# Patient Record
Sex: Male | Born: 1975 | Race: White | Hispanic: No | Marital: Single | Smoking: Current every day smoker
Health system: Southern US, Community
[De-identification: ages and names within clinical notes are randomized; demographics above are authoritative.]

## PROBLEM LIST (undated history)

## (undated) DIAGNOSIS — I10 Essential (primary) hypertension: Secondary | ICD-10-CM

## (undated) DIAGNOSIS — F1410 Cocaine abuse, uncomplicated: Secondary | ICD-10-CM

---

## 2014-10-02 ENCOUNTER — Emergency Department (HOSPITAL_COMMUNITY): Payer: Self-pay

## 2014-10-02 ENCOUNTER — Encounter (HOSPITAL_COMMUNITY): Payer: Self-pay | Admitting: Emergency Medicine

## 2014-10-02 ENCOUNTER — Emergency Department (HOSPITAL_COMMUNITY)
Admission: EM | Admit: 2014-10-02 | Discharge: 2014-10-02 | Disposition: A | Discharge status: Home / Self Care | Payer: Self-pay | Attending: Emergency Medicine | Admitting: Emergency Medicine

## 2014-10-02 DIAGNOSIS — R000 Tachycardia, unspecified: Secondary | ICD-10-CM | POA: Insufficient documentation

## 2014-10-02 DIAGNOSIS — Z720 Tobacco use: Secondary | ICD-10-CM | POA: Insufficient documentation

## 2014-10-02 DIAGNOSIS — I10 Essential (primary) hypertension: Secondary | ICD-10-CM | POA: Insufficient documentation

## 2014-10-02 DIAGNOSIS — Z79899 Other long term (current) drug therapy: Secondary | ICD-10-CM | POA: Insufficient documentation

## 2014-10-02 DIAGNOSIS — R079 Chest pain, unspecified: Secondary | ICD-10-CM | POA: Insufficient documentation

## 2014-10-02 HISTORY — DX: Essential (primary) hypertension: I10

## 2014-10-02 LAB — BASIC METABOLIC PANEL
Anion gap: 11 (ref 5–15)
BUN: 10 mg/dL (ref 6–23)
CALCIUM: 9.2 mg/dL (ref 8.4–10.5)
CHLORIDE: 106 mmol/L (ref 96–112)
CO2: 22 mmol/L (ref 19–32)
CREATININE: 0.98 mg/dL (ref 0.50–1.35)
GFR calc Af Amer: >90 mL/min (ref >90)
GFR calc non Af Amer: >90 mL/min (ref >90)
GLUCOSE: 138 mg/dL — AB (ref 70–99)
Potassium: 3.3 mmol/L — ABNORMAL LOW (ref 3.5–5.1)
SODIUM: 139 mmol/L (ref 135–145)

## 2014-10-02 LAB — CBC
HCT: 47.1 % (ref 39.0–52.0)
Hemoglobin: 16.4 g/dL (ref 13.0–17.0)
MCH: 31.9 pg (ref 26.0–34.0)
MCHC: 34.8 g/dL (ref 30.0–36.0)
MCV: 91.6 fL (ref 78.0–100.0)
Platelets: 190 10*3/uL (ref 150–400)
RBC: 5.14 MIL/uL (ref 4.22–5.81)
RDW: 13.4 % (ref 11.5–15.5)
WBC: 11.8 10*3/uL — ABNORMAL HIGH (ref 4.0–10.5)

## 2014-10-02 LAB — I-STAT TROPONIN, ED: Troponin i, poc: 0 ng/mL (ref 0.00–0.08)

## 2014-10-02 MED ORDER — LORAZEPAM 2 MG/ML IJ SOLN
2.0000 mg | Freq: Once | INTRAMUSCULAR | Status: AC
  Administered 2014-10-02: 2 mg via INTRAVENOUS

## 2014-10-02 MED ORDER — SODIUM CHLORIDE 0.9 % IV BOLUS (SEPSIS)
1000.0000 mL | Freq: Once | INTRAVENOUS | Status: AC
  Administered 2014-10-02: 1000 mL via INTRAVENOUS

## 2014-10-02 MED ORDER — ONDANSETRON HCL 4 MG/2ML IJ SOLN
4.0000 mg | Freq: Once | INTRAMUSCULAR | Status: AC
  Administered 2014-10-02: 4 mg via INTRAVENOUS
  Filled 2014-10-02: qty 2

## 2014-10-02 MED ORDER — LORAZEPAM 2 MG/ML IJ SOLN
1.0000 mg | INTRAMUSCULAR | Status: DC | PRN
  Filled 2014-10-02: qty 1

## 2014-10-02 MED ORDER — MORPHINE SULFATE 4 MG/ML IJ SOLN
4.0000 mg | INTRAMUSCULAR | Status: DC | PRN
  Administered 2014-10-02: 4 mg via INTRAVENOUS
  Filled 2014-10-02: qty 1

## 2014-10-02 MED ORDER — DILTIAZEM HCL 25 MG/5ML IV SOLN
20.0000 mg | Freq: Once | INTRAVENOUS | Status: AC
Start: 2014-10-02 — End: 2014-10-02
  Administered 2014-10-02: 20 mg via INTRAVENOUS
  Filled 2014-10-02: qty 5

## 2014-10-02 NOTE — ED Notes (Signed)
Pt states he feels better, denies any pain at this time, and wants to be discharged. Pt refusing any more blood work. Dr Fayrene FearingJames aware.

## 2014-10-02 NOTE — ED Notes (Signed)
Pt c/o left chest starting one hour ago. He reports snorting cocaine and taking a Viagra earlier today. Nausea  And shortness of breath present.

## 2014-10-02 NOTE — Discharge Instructions (Signed)
Avoid cocaine.  Stop smoking.  Return to ER with any worsening of symptoms. Chest Pain (Nonspecific) It is often hard to give a specific diagnosis for the cause of chest pain. There is always a chance that your pain could be related to something serious, such as a heart attack or a blood clot in the lungs. You need to follow up with your health care provider for further evaluation. CAUSES   Heartburn.  Pneumonia or bronchitis.  Anxiety or stress.  Inflammation around your heart (pericarditis) or lung (pleuritis or pleurisy).  A blood clot in the lung.  A collapsed lung (pneumothorax). It can develop suddenly on its own (spontaneous pneumothorax) or from trauma to the chest.  Shingles infection (herpes zoster virus). The chest wall is composed of bones, muscles, and cartilage. Any of these can be the source of the pain.  The bones can be bruised by injury.  The muscles or cartilage can be strained by coughing or overwork.  The cartilage can be affected by inflammation and become sore (costochondritis). DIAGNOSIS  Lab tests or other studies may be needed to find the cause of your pain. Your health care provider may have you take a test called an ambulatory electrocardiogram (ECG). An ECG records your heartbeat patterns over a 24-hour period. You may also have other tests, such as:  Transthoracic echocardiogram (TTE). During echocardiography, sound waves are used to evaluate how blood flows through your heart.  Transesophageal echocardiogram (TEE).  Cardiac monitoring. This allows your health care provider to monitor your heart rate and rhythm in real time.  Holter monitor. This is a portable device that records your heartbeat and can help diagnose heart arrhythmias. It allows your health care provider to track your heart activity for several days, if needed.  Stress tests by exercise or by giving medicine that makes the heart beat faster. TREATMENT   Treatment depends on what  may be causing your chest pain. Treatment may include:  Acid blockers for heartburn.  Anti-inflammatory medicine.  Pain medicine for inflammatory conditions.  Antibiotics if an infection is present.  You may be advised to change lifestyle habits. This includes stopping smoking and avoiding alcohol, caffeine, and chocolate.  You may be advised to keep your head raised (elevated) when sleeping. This reduces the chance of acid going backward from your stomach into your esophagus. Most of the time, nonspecific chest pain will improve within 2-3 days with rest and mild pain medicine.  HOME CARE INSTRUCTIONS   If antibiotics were prescribed, take them as directed. Finish them even if you start to feel better.  For the next few days, avoid physical activities that bring on chest pain. Continue physical activities as directed.  Do not use any tobacco products, including cigarettes, chewing tobacco, or electronic cigarettes.  Avoid drinking alcohol.  Only take medicine as directed by your health care provider.  Follow your health care provider's suggestions for further testing if your chest pain does not go away.  Keep any follow-up appointments you made. If you do not go to an appointment, you could develop lasting (chronic) problems with pain. If there is any problem keeping an appointment, call to reschedule. SEEK MEDICAL CARE IF:   Your chest pain does not go away, even after treatment.  You have a rash with blisters on your chest.  You have a fever. SEEK IMMEDIATE MEDICAL CARE IF:   You have increased chest pain or pain that spreads to your arm, neck, jaw, back, or abdomen.  You have shortness of breath.  You have an increasing cough, or you cough up blood.  You have severe back or abdominal pain.  You feel nauseous or vomit.  You have severe weakness.  You faint.  You have chills. This is an emergency. Do not wait to see if the pain will go away. Get medical help at  once. Call your local emergency services (911 in [place] Do not drive yourself to the hospital. MAKE SURE YOU:   Understand these instructions.  Will watch your condition.  Will get help right away if you are not doing well or get worse. Document Released: 03/27/2005 Document Revised: 06/22/2013 Document Reviewed: 01/21/2008 [facility] Patient Information 2015 [place], [place]. This information is not intended to replace advice given to you by your health care provider. Make sure you discuss any questions you have with your health care provider.

## 2014-10-02 NOTE — ED Notes (Signed)
Pt to CT

## 2014-10-02 NOTE — ED Provider Notes (Addendum)
CSN: [id number]     Arrival date & time 10/02/14  1645 History   First MD Initiated Contact with Patient 10/02/14 1710     Chief Complaint  Patient presents with  . Chest Pain      HPI  She presents for evaluation of chest pain. Pain started approximately 1 a.m. States he snorted cocaine this morning and use Viagra. No history of any heart disease. States he uses cocaine "some" last use was to 3 years ago. Denies episodes of chest pain associated with cocaine use. No known heart disease. History of hypertension, not currently medicated.  Since his back hurts but it "always hurts". Had some pain in the anterior aspect of his neck earlier today "it's gone now".  Past Medical History  Diagnosis Date  . Hypertension    History reviewed. No pertinent past surgical history. History reviewed. No pertinent family history. History  Substance Use Topics  . Smoking status: Current Every Day Smoker -- 1.00 packs/day    Types: Cigarettes  . Smokeless tobacco: Not on file  . Alcohol Use: Yes     Comment: social    Review of Systems  Constitutional: Negative for fever, chills, diaphoresis, appetite change and fatigue.  HENT: Negative for mouth sores, sore throat and trouble swallowing.   Eyes: Negative for visual disturbance.  Respiratory: Negative for cough, chest tightness, shortness of breath and wheezing.   Cardiovascular: Negative for chest pain.  Gastrointestinal: Negative for nausea, vomiting, abdominal pain, diarrhea and abdominal distention.  Endocrine: Negative for polydipsia, polyphagia and polyuria.  Genitourinary: Negative for dysuria, frequency and hematuria.  Musculoskeletal: Negative for gait problem.  Skin: Negative for color change, pallor and rash.  Neurological: Negative for dizziness, syncope, light-headedness and headaches.  Hematological: Does not bruise/bleed easily.  Psychiatric/Behavioral: Negative for behavioral problems and confusion.      Allergies   Iodine and Shellfish allergy  Home Medications   Prior to Admission medications   Medication Sig Start Date End Date Taking? Authorizing Provider  Chlorpheniramine Maleate (ALLERGY PO) Take 1 tablet by mouth daily.   Yes Historical Provider, MD  metoprolol tartrate (LOPRESSOR) 25 MG tablet Take 25 mg by mouth 2 (two) times daily.   Yes Historical Provider, MD  Ranitidine HCl (ACID REDUCER PO) Take 1 tablet by mouth daily.   Yes Historical Provider, MD   BP 125/64 mmHg  Pulse 118  Temp(Src) 98.8 F (37.1 C) (Oral)  Resp 18  Wt 215 lb (97.523 kg)  SpO2 96% Physical Exam  ED Course  Procedures (including critical care time) Labs Review Labs Reviewed  CBC - Abnormal; Notable for the following:    WBC 11.8 (*)    All other components within normal limits  BASIC METABOLIC PANEL - Abnormal; Notable for the following:    Potassium 3.3 (*)    Glucose, Bld 138 (*)    All other components within normal limits  I-STAT TROPOININ, ED    Imaging Review No results found.   EKG Interpretation   Date/Time:  Sunday October 02 2014 16:58:29 EDT Ventricular Rate:  123 PR Interval:  180 QRS Duration: 93 QT Interval:  317 QTC Calculation: 453 R Axis:   92 Text Interpretation:  Sinus tachycardia Prominent P waves, nondiagnostic  Borderline right axis deviation Anteroseptal infarct, old Baseline wander  in lead(s) V3 Confirmed by Fayrene Fearing  MD, Charisse Wendell ([id number]) on 10/02/2014 5:23:19 PM      MDM   Final diagnoses:  Chest pain    Pt with tachycardia.  EKG without acute changes to suggest ischemia.  Neg d dimer.  No abnormality on CT of the chest without contrast. I requested serial enzymes over the next 3-6 hours. Patient declines and refuses. States his symptoms are resolved. Recheck heart rate 103. Have asked to return to emergency with any time. Avoid cocaine, avoid tobacco.    Rolland PorterMark Arshia Spellman, MD 10/07/14 [id number]  Rolland PorterMark Jimie Kuwahara, MD 10/07/14 [phone]

## 2014-11-01 ENCOUNTER — Encounter (HOSPITAL_COMMUNITY): Payer: Self-pay

## 2014-11-01 ENCOUNTER — Emergency Department (HOSPITAL_COMMUNITY)
Admission: EM | Admit: 2014-11-01 | Discharge: 2014-11-01 | Disposition: A | Discharge status: Home / Self Care | Payer: Self-pay | Attending: Emergency Medicine | Admitting: Emergency Medicine

## 2014-11-01 DIAGNOSIS — M546 Pain in thoracic spine: Secondary | ICD-10-CM | POA: Insufficient documentation

## 2014-11-01 DIAGNOSIS — I10 Essential (primary) hypertension: Secondary | ICD-10-CM | POA: Insufficient documentation

## 2014-11-01 DIAGNOSIS — Z79899 Other long term (current) drug therapy: Secondary | ICD-10-CM | POA: Insufficient documentation

## 2014-11-01 DIAGNOSIS — M542 Cervicalgia: Secondary | ICD-10-CM | POA: Insufficient documentation

## 2014-11-01 DIAGNOSIS — M549 Dorsalgia, unspecified: Secondary | ICD-10-CM

## 2014-11-01 DIAGNOSIS — Z720 Tobacco use: Secondary | ICD-10-CM | POA: Insufficient documentation

## 2014-11-01 LAB — COMPREHENSIVE METABOLIC PANEL
ALT: 17 U/L (ref 17–63)
AST: 15 U/L (ref 15–41)
Albumin: 4.1 g/dL (ref 3.5–5.0)
Alkaline Phosphatase: 57 U/L (ref 38–126)
Anion gap: 7 (ref 5–15)
BUN: 6 mg/dL (ref 6–20)
CALCIUM: 8.8 mg/dL — AB (ref 8.9–10.3)
CHLORIDE: 105 mmol/L (ref 101–111)
CO2: 28 mmol/L (ref 22–32)
CREATININE: 0.88 mg/dL (ref 0.61–1.24)
GFR calc Af Amer: >60 mL/min (ref >60)
GFR calc non Af Amer: >60 mL/min (ref >60)
GLUCOSE: 103 mg/dL — AB (ref 70–99)
POTASSIUM: 3.5 mmol/L (ref 3.5–5.1)
Sodium: 140 mmol/L (ref 135–145)
Total Bilirubin: 0.6 mg/dL (ref 0.3–1.2)
Total Protein: 6.9 g/dL (ref 6.5–8.1)

## 2014-11-01 LAB — CBC WITH DIFFERENTIAL/PLATELET
BASOS ABS: 0 10*3/uL (ref 0.0–0.1)
Basophils Relative: 1 % (ref 0–1)
EOS PCT: 3 % (ref 0–5)
Eosinophils Absolute: 0.2 10*3/uL (ref 0.0–0.7)
HCT: 45.5 % (ref 39.0–52.0)
Hemoglobin: 15.4 g/dL (ref 13.0–17.0)
Lymphocytes Relative: 27 % (ref 12–46)
Lymphs Abs: 1.9 10*3/uL (ref 0.7–4.0)
MCH: 31.5 pg (ref 26.0–34.0)
MCHC: 33.8 g/dL (ref 30.0–36.0)
MCV: 93 fL (ref 78.0–100.0)
Monocytes Absolute: 0.7 10*3/uL (ref 0.1–1.0)
Monocytes Relative: 9 % (ref 3–12)
NEUTROS ABS: 4.2 10*3/uL (ref 1.7–7.7)
Neutrophils Relative %: 60 % (ref 43–77)
PLATELETS: 189 10*3/uL (ref 150–400)
RBC: 4.89 MIL/uL (ref 4.22–5.81)
RDW: 14 % (ref 11.5–15.5)
WBC: 7 10*3/uL (ref 4.0–10.5)

## 2014-11-01 MED ORDER — ACETAMINOPHEN 500 MG PO TABS
1000.0000 mg | ORAL_TABLET | Freq: Once | ORAL | Status: AC
  Administered 2014-11-01: 1000 mg via ORAL
  Filled 2014-11-01: qty 2

## 2014-11-01 MED ORDER — IBUPROFEN 800 MG PO TABS
800.0000 mg | ORAL_TABLET | Freq: Once | ORAL | Status: AC
  Administered 2014-11-01: 800 mg via ORAL
  Filled 2014-11-01: qty 1

## 2014-11-01 MED ORDER — NAPROXEN 500 MG PO TABS: 500.0000 mg | ORAL_TABLET | Freq: Two times a day (BID) | ORAL | Status: AC

## 2014-11-01 MED ORDER — CYCLOBENZAPRINE HCL 10 MG PO TABS: 10.0000 mg | ORAL_TABLET | Freq: Two times a day (BID) | ORAL | Status: AC | PRN

## 2014-11-01 NOTE — ED Notes (Signed)
MD at bedside. 

## 2014-11-01 NOTE — ED Notes (Signed)
Pain to neck for last 2 weeks.  C/o tingling to right arm, numbness in fingers.  Rates pain 4/10 now.

## 2014-11-01 NOTE — ED Provider Notes (Signed)
CSN: [id number]     Arrival date & time 11/01/14  4098 History   First MD Initiated Contact with Patient 11/01/14 [phone]     Chief Complaint  Patient presents with  . Neck Pain     (Consider location/radiation/quality/duration/timing/severity/associated sxs/prior Treatment) HPI... Pain in right upper back for 2 weeks. No fever, sweats, chills. Patient runs a logging machine at work. Questionable tingling in his bilateral fingers right greater than left. He is ambulatory. No acute distress.  No chest pain, dyspnea, diaphoresis, nausea. Past medical history hypertension. Cigarette smoker  Past Medical History  Diagnosis Date  . Hypertension    History reviewed. No pertinent past surgical history. No family history on file. History  Substance Use Topics  . Smoking status: Current Every Day Smoker -- 1.00 packs/day    Types: Cigarettes  . Smokeless tobacco: Not on file  . Alcohol Use: Yes     Comment: social    Review of Systems  All other systems reviewed and are negative.     Allergies  Iodine and Shellfish allergy  Home Medications   Prior to Admission medications   Medication Sig Start Date End Date Taking? Authorizing Provider  Chlorpheniramine Maleate (ALLERGY PO) Take 1 tablet by mouth daily.    Historical Provider, MD  cyclobenzaprine (FLEXERIL) 10 MG tablet Take 1 tablet (10 mg total) by mouth 2 (two) times daily as needed for muscle spasms. 11/01/14   Donnetta Hutching, MD  metoprolol tartrate (LOPRESSOR) 25 MG tablet Take 25 mg by mouth 2 (two) times daily.    Historical Provider, MD  naproxen (NAPROSYN) 500 MG tablet Take 1 tablet (500 mg total) by mouth 2 (two) times daily. 11/01/14   Donnetta Hutching, MD  Ranitidine HCl (ACID REDUCER PO) Take 1 tablet by mouth daily.    Historical Provider, MD   BP 140/76 mmHg  Pulse 80  Temp(Src) 98.4 F (36.9 C) (Oral)  Resp 18  Ht  (1.854 m)  Wt 215 lb (97.523 kg)  BMI 28.37 kg/m2  SpO2 100% Physical Exam  Constitutional: He is  oriented to person, place, and time. He appears well-developed and well-nourished.  HENT:  Head: Normocephalic and atraumatic.  Eyes: Conjunctivae and EOM are normal. Pupils are equal, round, and reactive to light.  Neck: Normal range of motion. Neck supple.  Cardiovascular: Normal rate and regular rhythm.   Pulmonary/Chest: Effort normal and breath sounds normal.  Abdominal: Soft. Bowel sounds are normal.  Musculoskeletal: Normal range of motion.  Minimal right upper back tenderness  Neurological: He is alert and oriented to person, place, and time.  Skin: Skin is warm and dry.  Psychiatric: He has a normal mood and affect. His behavior is normal.  Nursing note and vitals reviewed.   ED Course  Procedures (including critical care time) Labs Review Labs Reviewed  COMPREHENSIVE METABOLIC PANEL - Abnormal; Notable for the following:    Glucose, Bld 103 (*)    Calcium 8.8 (*)    All other components within normal limits  CBC WITH DIFFERENTIAL/PLATELET    Imaging Review No results found.   EKG Interpretation   Date/Time:  Tuesday Nov 01 2014 07:42:39 EDT Ventricular Rate:  88 PR Interval:  185 QRS Duration: 84 QT Interval:  346 QTC Calculation: 419 R Axis:   79 Text Interpretation:  Sinus rhythm Confirmed by Wynter Isaacs  MD, Hazell Siwik ([id number]) on  11/01/2014 9:00:07 AM      MDM   Final diagnoses:  Neck pain  Upper back pain  on right side    Patient has a grossly normal neurological exam. EKG normal. Labs normal. Discharge medications Flexeril 10 mg and Naprosyn 500 mg. He will get primary care follow-up.    Donnetta HutchingBrian Tayna Smethurst, MD 11/01/14 1002

## 2014-11-01 NOTE — Discharge Instructions (Signed)
EKG and blood work were good. Medication for pain and muscle spasm. Need to get a primary care doctor. Resource guide given.    Emergency Department Resource Guide 1) Find a Doctor and Pay Out of Pocket Although you won't have to find out who is covered by your insurance plan, it is a good idea to ask around and get recommendations. You will then need to call the office and see if the doctor you have chosen will accept you as a new patient and what types of options they offer for patients who are self-pay. Some doctors offer discounts or will set up payment plans for their patients who do not have insurance, but you will need to ask so you aren't surprised when you get to your appointment.  2) Contact Your Local Health Department Not all health departments have doctors that can see patients for sick visits, but many do, so it is worth a call to see if yours does. If you don't know where your local health department is, you can check in your phone book. The CDC also has a tool to help you locate your state's health department, and many state websites also have listings of all of their local health departments.  3) Find a Walk-in Clinic If your illness is not likely to be very severe or complicated, you may want to try a walk in clinic. These are popping up all over the country in pharmacies, drugstores, and shopping centers. They're usually staffed by nurse practitioners or physician assistants that have been trained to treat common illnesses and complaints. They're usually fairly quick and inexpensive. However, if you have serious medical issues or chronic medical problems, these are probably not your best option.  No Primary Care Doctor: - Call Health Connect at  [phone] - they can help you locate a primary care doctor that  accepts your insurance, provides certain services, etc. - Physician Referral Service- [phone]  Chronic Pain Problems: Organization         Address  Phone    Notes  [facility]  [phone] Patients need to be referred by their primary care doctor.   Medication Assistance: Organization         Address  Phone   Notes  [facility] [address] [place], [place] [address] [phone]) [phone] --Must be a resident of [facility] [place] -- Must have NO insurance coverage whatsoever (no Medicaid/ Medicare, etc.) -- The pt. MUST have a primary care doctor that directs their care regularly and follows them in the community   Med[facility]ist  [phone]) [phone]   [employer]  [phone]    Agencies that provide inexpensive medical care: Organization         Address  Phone   Notes  [facility]  [phone](336) [phone]   [facility]    [phone](336) [phone]   [facility] [address] [place], [place] [address] [phone]) [phone]   [facility] [address], [place] [phone]) [phone]   [facility]    [phone]   [facility]    [phone](336) [phone]   [facility]  [address], [place] Phone:  [phone](336) [phone], Fax:  [phone] Hours of Operation:  9 am - 6 pm, M-F.  Also accepts Medicaid/Medicare and self-pay.  [facility]  [address], [place] Phone: [phone](336)  [phone], Fax: [phone]. Hours of Operation:  8:30 am - 5:30 pm, M-F.  Also accepts Medicaid and self-pay.  [facility] [place] [address] [place] Phone: [phone]   [place], [place], [place] [phone], Ext. [phone] Mondays & Thursdays: 7-9 AM.  First 15 patients are seen on a first come, first serve basis.    [place] Providers:  Organization         Address  Phone   Notes  [facility] [address], [place] [phone] Also accepts self-pay patients.  [facility]  [id number] [place], [place]  [phone]   [place], [address], [place] [phone]   [facility] [address] [place] [phone]   Lucianne Lei [address], [place]   [phone] Only accepts [place] Access [place] patients after they have their name applied to their card.   Self-Pay (no insurance) in [facility]:  Organization         Address  Phone   Notes  Sickle Cell Patients, [facility] [place] [phone]   [facility] [place] [phone]   [facility] [place]  [place], [place], [place] [phone]   [facility]/Dr. Osei-Bonsu  [address], [place] or [address], [place] [phone] Phone number for both Dent and [place] locations is the same.  [facility] [address], [place] [phone]   [facility] [address] [place] or [address] [phone]   [facility] [address] [place] [phone], phone; [phone], fax Sees patients 1st and 3rd Saturday of every month.  Must not qualify for public or private insurance (i.e. Medicaid, Medicare, [place] Health Choice, Veterans' Benefits)  Household income should be no more than 200% of the poverty level The clinic cannot treat you if you are pregnant or think you are pregnant  Sexually transmitted diseases are not treated at the clinic.    Dental Care: Organization         Address  Phone  Notes  [facility] [place] [phone] Accepts children up to age 49 who are enrolled in [place] or [place]; pregnant women with a Medicaid card; and children who have  applied for Medicaid or [place] Health Choice, but were declined, whose parents can pay a reduced fee at time of service.  [facility] Department of [facility]  [address], [place] [phone] Accepts children up to age 43 who are enrolled in [place] or [place]; pregnant women with a Medicaid card; and children who have applied for Medicaid or [place] Health Choice, but were declined, whose parents can pay a reduced fee at time of service.  [place]  [place] [phone] Patients are seen by appointment only. Walk-ins are not accepted. [place] will see patients 75 years of age and older. Monday - Tuesday (8am-5pm) Most Wednesdays (8:30-5pm) $30 per visit, cash only  [facility]  [address], [employer]  [place] [phone] Patients are seen by appointment only. Walk-ins are not accepted. [place] will see patients 52 years of age and older. One Wednesday Evening (Monthly: Volunteer Based).  $30 per visit, cash only  [place]  [phone] for adults; Children under age 58, call Graduate Pediatric Dentistry at [phone]. Children aged 77-14, please call [phone] to request a pediatric application.  Dental services are provided in all areas of dental care including fillings, crowns and bridges, complete and partial dentures, implants, gum treatment, root canals, and extractions. Preventive care is also provided. Treatment is provided to both adults and children. Patients are selected via a lottery and there is often a waiting list.   [facility] [address], [place]  [phone] [email or web address]   [facility] [address] [place], [place] [phone], Ext. [phone] Second and Fourth Thursday of each month, opens at 6:30 AM; Clinic ends at 9 AM.  Patients are seen on a first-come first-served basis, and a  limited number are seen during each clinic.   [facility]  [address] Hillard Danker [place], [place] [phone]   Eligibility Requirements You must have lived in [place], [place], or [place] counties for at least the last three months.   You cannot be eligible for state or federal sponsored [employer], including [employer], [place], or [employer].   You generally cannot be eligible for healthcare insurance through your employer.    How to apply: Eligibility screenings are held every Tuesday and Wednesday afternoon from 1:00 pm until 4:00 pm. You do not need an appointment for the interview!  [facility] [address], [place], [place]   [place]  [place] Department  [place]  [phone]    Behavioral Health Resources in the Community: Intensive Outpatient Programs Organization         Address  Phone  Notes  [place]. [address], [place], [place] [phone]   [facility] [address] [place], [place]   [facility]: [facility] [address] [place], [place]   [place] [address]  [place], [place] or [phone]   Substance Abuse Resources Organization         Address  Phone  Notes  [facility]  [phone]   [place]  [phone]   [facility] [place]   Chinita Pester  [phone]   Residential & Outpatient Substance Abuse Program  [phone]   Psychological Services Organization         Address  Phone  Notes  [facility] [place]  [place]  [phone]   [place] [address] [place] or [phone]    Mobile Crisis Teams Organization          Address  Phone  Notes  [facility], Mobile Crisis Care Unit  [phone]   [facility]  [address] [place], [place]   [place] [address] [place] [phone]    Self-Help/Support Groups Organization         Address  Phone             Notes  [place]. of [place] - variety of support groups  [phone]- [phone] Call for more information  °Narcotics Anonymous (NA), [facility] [address], °[place]  2 meetings at this location  ° °Residential Treatment Programs °Organization         Address  Phone  Notes  °[facility] [address],    °[place]  [phone]   °[facility] ° [address], [place], [place] [phone]   °[facility] [address], [place] [phone] Admissions: 8am-3pm M-F  °[facility] [address]    °[place], [place] [phone]   °[facility] [address], [place], [place] [phone]   °[facility] [address]  °[place], [place] [phone]   °[facility] - Intensive Outpatient [address], [place], [place] [phone]   °[facility] [address]  °[place], [place] 1-877-615-2722 or [phone]   °[facility] ([facility]) [address] [place], [place] [phone] Accepts Medicaid  °[facility] [address]  °[place] 1-800-659-3381 Substance Abuse/Addiction Treatment  ° °[place] Behavioral Health Resources °Organization         Address  Phone  Notes  °[facility]  (888) [phone]   °Julie Brannon, PhD [address] [place], [place]   ([phone]) [phone] or (336) 951-0000   °[facility]   [address] °[place], [place] ([phone]) [phone]   °[facility] [address], [place], [place] (336) [phone] Insurance/Medicaid/sponsorship  through [facility]  °[facility] [address]                                    [place], [place] ([phone]) [phone] Therapy/tele-psych/case  °[facility] [address]  ° [place], [place] (336) [phone]    °Dr. Arfeen  ([phone]) [phone]   °[facility] [place]  [employer] [place] [facility] 1) [address], [place] °2) [address], [place] °3)  [address], [place] (336) [phone] °(336) [phone] ° °(336) [phone]   °[place] Child Abuse Hotline (336) 342-1394 or (336) 342-3537 (After Hours)    ° ° °

## 2014-11-01 NOTE — ED Notes (Signed)
Pt reports pain in back of neck radiating into r shoulder blade.  Reports worse when moves r arm.  Also c/o multiple areas of numbness that comes and goes on face and on left side of neck.  Denies injury.  Denies fever.

## 2014-11-12 ENCOUNTER — Encounter (HOSPITAL_COMMUNITY): Payer: Self-pay

## 2014-11-12 ENCOUNTER — Emergency Department (HOSPITAL_COMMUNITY): Payer: Self-pay

## 2014-11-12 ENCOUNTER — Emergency Department (HOSPITAL_COMMUNITY)
Admission: EM | Admit: 2014-11-12 | Discharge: 2014-11-12 | Disposition: A | Discharge status: Home / Self Care | Payer: Self-pay | Attending: Emergency Medicine | Admitting: Emergency Medicine

## 2014-11-12 DIAGNOSIS — F1490 Cocaine use, unspecified, uncomplicated: Secondary | ICD-10-CM | POA: Insufficient documentation

## 2014-11-12 DIAGNOSIS — Z720 Tobacco use: Secondary | ICD-10-CM | POA: Insufficient documentation

## 2014-11-12 DIAGNOSIS — I10 Essential (primary) hypertension: Secondary | ICD-10-CM | POA: Insufficient documentation

## 2014-11-12 DIAGNOSIS — F419 Anxiety disorder, unspecified: Secondary | ICD-10-CM | POA: Insufficient documentation

## 2014-11-12 DIAGNOSIS — Z79899 Other long term (current) drug therapy: Secondary | ICD-10-CM | POA: Insufficient documentation

## 2014-11-12 DIAGNOSIS — R000 Tachycardia, unspecified: Secondary | ICD-10-CM | POA: Insufficient documentation

## 2014-11-12 DIAGNOSIS — Z791 Long term (current) use of non-steroidal anti-inflammatories (NSAID): Secondary | ICD-10-CM | POA: Insufficient documentation

## 2014-11-12 DIAGNOSIS — R0789 Other chest pain: Secondary | ICD-10-CM | POA: Insufficient documentation

## 2014-11-12 HISTORY — DX: Cocaine abuse, uncomplicated: F14.10

## 2014-11-12 LAB — COMPREHENSIVE METABOLIC PANEL
ALT: 18 U/L (ref 17–63)
AST: 18 U/L (ref 15–41)
Albumin: 4.4 g/dL (ref 3.5–5.0)
Alkaline Phosphatase: 57 U/L (ref 38–126)
Anion gap: 4 — ABNORMAL LOW (ref 5–15)
BUN: 5 mg/dL — AB (ref 6–20)
CALCIUM: 8.4 mg/dL — AB (ref 8.9–10.3)
CO2: 22 mmol/L (ref 22–32)
Chloride: 116 mmol/L — ABNORMAL HIGH (ref 101–111)
Creatinine, Ser: 0.97 mg/dL (ref 0.61–1.24)
GFR calc Af Amer: >60 mL/min (ref >60)
GFR calc non Af Amer: >60 mL/min (ref >60)
GLUCOSE: 99 mg/dL (ref 65–99)
POTASSIUM: 3.3 mmol/L — AB (ref 3.5–5.1)
Sodium: 142 mmol/L (ref 135–145)
Total Bilirubin: 0.3 mg/dL (ref 0.3–1.2)
Total Protein: 6.9 g/dL (ref 6.5–8.1)

## 2014-11-12 LAB — CBC WITH DIFFERENTIAL/PLATELET
Basophils Absolute: 0.1 10*3/uL (ref 0.0–0.1)
Basophils Relative: 1 % (ref 0–1)
EOS PCT: 1 % (ref 0–5)
Eosinophils Absolute: 0.1 10*3/uL (ref 0.0–0.7)
HEMATOCRIT: 43.5 % (ref 39.0–52.0)
HEMOGLOBIN: 14.8 g/dL (ref 13.0–17.0)
LYMPHS ABS: 1.6 10*3/uL (ref 0.7–4.0)
Lymphocytes Relative: 15 % (ref 12–46)
MCH: 31.3 pg (ref 26.0–34.0)
MCHC: 34 g/dL (ref 30.0–36.0)
MCV: 92 fL (ref 78.0–100.0)
MONOS PCT: 6 % (ref 3–12)
Monocytes Absolute: 0.6 10*3/uL (ref 0.1–1.0)
NEUTROS ABS: 8.1 10*3/uL — AB (ref 1.7–7.7)
Neutrophils Relative %: 77 % (ref 43–77)
Platelets: 187 10*3/uL (ref 150–400)
RBC: 4.73 MIL/uL (ref 4.22–5.81)
RDW: 13.6 % (ref 11.5–15.5)
WBC: 10.4 10*3/uL (ref 4.0–10.5)

## 2014-11-12 LAB — URINALYSIS, ROUTINE W REFLEX MICROSCOPIC
BILIRUBIN URINE: NEGATIVE
GLUCOSE, UA: NEGATIVE mg/dL
HGB URINE DIPSTICK: NEGATIVE
Ketones, ur: NEGATIVE mg/dL
LEUKOCYTES UA: NEGATIVE
NITRITE: NEGATIVE
Protein, ur: NEGATIVE mg/dL
Specific Gravity, Urine: 1.01 (ref 1.005–1.030)
UROBILINOGEN UA: 0.2 mg/dL (ref 0.0–1.0)
pH: 6.5 (ref 5.0–8.0)

## 2014-11-12 LAB — RAPID URINE DRUG SCREEN, HOSP PERFORMED
Amphetamines: NOT DETECTED
Barbiturates: NOT DETECTED
Benzodiazepines: NOT DETECTED
COCAINE: NOT DETECTED
Opiates: NOT DETECTED
TETRAHYDROCANNABINOL: NOT DETECTED

## 2014-11-12 LAB — TROPONIN I: Troponin I: <0.03 ng/mL (ref <0.031)

## 2014-11-12 MED ORDER — LORAZEPAM 1 MG PO TABS
1.0000 mg | ORAL_TABLET | Freq: Once | ORAL | Status: AC
  Administered 2014-11-12: 1 mg via ORAL
  Filled 2014-11-12: qty 1

## 2014-11-12 MED ORDER — NITROGLYCERIN 2 % TD OINT
1.0000 [in_us] | TOPICAL_OINTMENT | Freq: Once | TRANSDERMAL | Status: DC
  Filled 2014-11-12: qty 1

## 2014-11-12 NOTE — ED Notes (Signed)
Pt reports centralized chest pain that started a few hours ago after snorting cocaine and taking a viagra.  Pt states at times the pain is worse with deep breath and movement

## 2014-11-12 NOTE — ED Provider Notes (Signed)
Pt received at sign out with troponin pending. Pt presented last night with CP after using cocaine. Pt was given ativan for anxiety. Pt has been sleeping most of his ED stay, NAD, resps easy.  EKG and troponin x2 reassuring. Doubt ACS at this time. Pt wants to go home now.  Pt encouraged to stop using cocaine, f/u Cards MD. Dx and testing d/w pt.  Questions answered.  Verb understanding, agreeable to d/c home with outpt f/u.   Samuel JesterKathleen Carrisa Keller, DO 11/12/14 [phone]

## 2014-11-12 NOTE — ED Provider Notes (Signed)
CSN: [id number]     Arrival date & time 11/12/14  0225 History   First MD Initiated Contact with Patient 11/12/14 [id number]     Chief Complaint  Patient presents with  . Chest Pain     (Consider location/radiation/quality/duration/timing/severity/associated sxs/prior Treatment) HPI  This is a 39 year old male with a history of hypertension who presents with chest pain. Patient reports several hours of pressure-like chest pain that has been steady. He rates his pain at 4 out of 10. Denies any shortness of breath or diaphoresis. No history of coronary artery disease. States that the pain started after he used cocaine earlier tonight. Also reports that he took Viagra. Patient states he feels very anxious.  Past Medical History  Diagnosis Date  . Hypertension    History reviewed. No pertinent past surgical history. No family history on file. History  Substance Use Topics  . Smoking status: Current Every Day Smoker -- 1.00 packs/day    Types: Cigarettes  . Smokeless tobacco: Not on file  . Alcohol Use: Yes     Comment: social    Review of Systems  Constitutional: Negative.  Negative for fever and diaphoresis.  Respiratory: Positive for chest tightness. Negative for shortness of breath.   Cardiovascular: Positive for chest pain. Negative for leg swelling.  Gastrointestinal: Negative.  Negative for abdominal pain.  Genitourinary: Negative.  Negative for dysuria.  Musculoskeletal: Negative for back pain.  Skin: Negative for rash.  Neurological: Negative for headaches.  All other systems reviewed and are negative.     Allergies  Iodine and Shellfish allergy  Home Medications   Prior to Admission medications   Medication Sig Start Date End Date Taking? Authorizing Provider  Chlorpheniramine Maleate (ALLERGY PO) Take 1 tablet by mouth daily.   Yes Historical Provider, MD  metoprolol tartrate (LOPRESSOR) 25 MG tablet Take 25 mg by mouth 2 (two) times daily.   Yes Historical  Provider, MD  Ranitidine HCl (ACID REDUCER PO) Take 1 tablet by mouth daily.   Yes Historical Provider, MD  sildenafil (REVATIO) 20 MG tablet Take 20 mg by mouth 3 (three) times daily.   Yes Historical Provider, MD  cyclobenzaprine (FLEXERIL) 10 MG tablet Take 1 tablet (10 mg total) by mouth 2 (two) times daily as needed for muscle spasms. 11/01/14   Donnetta HutchingBrian Cook, MD  naproxen (NAPROSYN) 500 MG tablet Take 1 tablet (500 mg total) by mouth 2 (two) times daily. 11/01/14   Donnetta HutchingBrian Cook, MD   BP 109/61 mmHg  Pulse 68  Temp(Src) 98.7 F (37.1 C) (Oral)  Resp 16  Ht 6\' 1"  (1.854 m)  Wt 215 lb (97.523 kg)  BMI 28.37 kg/m2  SpO2 97% Physical Exam  Constitutional: He is oriented to person, place, and time. He appears well-developed and well-nourished.  Anxious appearing  HENT:  Head: Normocephalic and atraumatic.  Eyes: Pupils are equal, round, and reactive to light.  Pupils 5 mm reactive bilaterally  Neck: Neck supple.  Cardiovascular: Regular rhythm and normal heart sounds.   No murmur heard. Tachycardia  Pulmonary/Chest: Effort normal and breath sounds normal. No respiratory distress. He has no wheezes. He exhibits no tenderness.  Abdominal: Soft. Bowel sounds are normal. There is no tenderness. There is no rebound.  Musculoskeletal: He exhibits no edema.  Lymphadenopathy:    He has no cervical adenopathy.  Neurological: He is alert and oriented to person, place, and time.  Skin: Skin is warm and dry.  Psychiatric:  Anxious  Nursing note and vitals reviewed.  ED Course  Procedures (including critical care time) Labs Review Labs Reviewed  CBC WITH DIFFERENTIAL/PLATELET - Abnormal; Notable for the following:    Neutro Abs 8.1 (*)    All other components within normal limits  COMPREHENSIVE METABOLIC PANEL - Abnormal; Notable for the following:    Potassium 3.3 (*)    Chloride 116 (*)    Calcium 8.4 (*)    Anion gap 4 (*)    All other components within normal limits  TROPONIN I   URINALYSIS, ROUTINE W REFLEX MICROSCOPIC  URINE RAPID DRUG SCREEN (HOSP PERFORMED)  TROPONIN I    Imaging Review Dg Chest Portable 1 View  11/12/2014   CLINICAL DATA:  Mid chest pain starting tonight. Shortness of breath. Productive cough.  EXAM: PORTABLE CHEST - 1 VIEW  COMPARISON:  10/02/2014  FINDINGS: The heart size and mediastinal contours are within normal limits. Both lungs are clear. The visualized skeletal structures are unremarkable.  IMPRESSION: No active disease.   Electronically Signed   By: Burman NievesWilliam  Stevens M.D.   On: 11/12/2014 03:04     EKG Interpretation   Date/Time:  Saturday Nov 12 2014 02:46:46 EDT Ventricular Rate:  111 PR Interval:  194 QRS Duration: 79 QT Interval:  317 QTC Calculation: 431 R Axis:   75 Text Interpretation:  Sinus tachycardia Probable left atrial enlargement P  No acute changes Confirmed by Suzannah Bettes  MD, Maclovio Henson ([id number]) on 11/12/2014  4:06:48 AM      MDM   Final diagnoses:  None    Patient presents for chest pain in the setting of cocaine use. Risk factors for ACS include hypertension and smoking.  Patient tachycardic and anxious on exam likely secondary to stimulant use. Not a candidate for nitroglycerin given recent Viagra use. Patient given Ativan. EKG is reassuring chest x-ray unremarkable.  Troponin negative.  5:48 AM On recheck, patient is resting comfortably, tachycardia is improved and vital signs stabilize. Reports some improvement of his chest pain. Given that he is a smoker and has a history of hypertension, will repeat troponin at 8 AM. Discussed with patient the risks of cocaine abuse and advised cessation. If repeat troponin negative, would advise cardiology follow-up.  Shon Batonourtney F Christin Mccreedy, MD 11/12/14 [id number]

## 2014-11-12 NOTE — Discharge Instructions (Signed)
°Emergency Department Resource Guide °1) Find a Doctor and Pay Out of Pocket °Although you won't have to find out who is covered by your insurance plan, it is a good idea to ask around and get recommendations. You will then need to call the office and see if the doctor you have chosen will accept you as a new patient and what types of options they offer for patients who are self-pay. Some doctors offer discounts or will set up payment plans for their patients who do not have insurance, but you will need to ask so you aren't surprised when you get to your appointment. ° °2) Contact Your Local Health Department °Not all health departments have doctors that can see patients for sick visits, but many do, so it is worth a call to see if yours does. If you don't know where your local health department is, you can check in your phone book. The CDC also has a tool to help you locate your state's health department, and many state websites also have listings of all of their local health departments. ° °3) Find a Walk-in Clinic °If your illness is not likely to be very severe or complicated, you may want to try a walk in clinic. These are popping up all over the country in pharmacies, drugstores, and shopping centers. They're usually staffed by nurse practitioners or physician assistants that have been trained to treat common illnesses and complaints. They're usually fairly quick and inexpensive. However, if you have serious medical issues or chronic medical problems, these are probably not your best option. ° °No Primary Care Doctor: °- Call [facility] at  832-8000 - they can help you locate a primary care doctor that  accepts your insurance, provides certain services, etc. °- Physician Referral Service- 1-800-533-3463 ° °Chronic Pain Problems: °Organization         Address  Phone   Notes  °[facility]  (336) 297-2271 Patients need to be referred by their primary care doctor.  ° °Medication  Assistance: °Organization         Address  Phone   Notes  °[place] [facility] [address] °[place], [place] [address] (336) 641-8030 --Must be a resident of [place] °-- Must have NO insurance coverage whatsoever (no Medicaid/ Medicare, etc.) °-- The pt. MUST have a primary care doctor that directs their care regularly and follows them in the community °  °[facility]  (866) 331-1348   °[employer]  (888) 892-1162   ° °Agencies that provide inexpensive medical care: °Organization         Address  Phone   Notes  °[facility]  (336) 832-8035   °[facility]    (336) 832-7272   °[facility] [address] °[place], [place] [address] (336) 832-4777   °[facility] [address], °[place] (336) 271-4999   °[facility]    (336) 373-0678   °[facility]    (336) 272-1050   °[facility] ° [address], [place] Phone:  (336) 832-4444, Fax:  (336) 832-4440 Hours of Operation:  9 am - 6 pm, M-F.  Also accepts Medicaid/Medicare and self-pay.  °[facility] ° [address], [place] Phone: (336) 832-3150, Fax: (336) [phone]. Hours of Operation:  8:30 am - 5:30 pm, M-F.  Also accepts Medicaid and self-pay.  °[facility] [place] [address], [place] Phone: (336) 878-6027   °[facility] [address], [place], [place] (336)723-1848, Ext. 123 Mondays & Thursdays: 7-9 AM.  First 15 patients are seen on a first come, first serve basis. °  ° °Medicaid-accepting [place] Providers: ° °Organization         Address  Phone   Notes  °[facility] [address], [place] (336) 641-2100 Also accepts self-pay patients.  °[facility] [address], [place] ° (336) 856-9996   °[facility] [address], [place]  (336) 288-8857   °[facility] [address] [place] [address], [place] (336) 299-7000   °Veita Bland [address], [place]  ° (336) 373-1557 Only accepts [place] Access Medicaid patients after they have their name applied to their card.  ° °Self-Pay (no insurance) in [place]: ° °Organization         Address  Phone   Notes  °Sickle Cell Patients, [facility] [address], [place] (336) 832-1970   °[facility] [address], [place] (336) 832-4400   °[facility] ° [address], [place] (336) 992-4800   °[facility]/Dr. Osei-Bonsu ° [address] [place] [address], [place] or [address], [place] (336) 841-8500 Phone number for both [place] and [place] locations is the same.  °[facility] [address], [place] (336) 299-0000   °[facility] [address] [place] [address], [place] or [address] (336) 852-7530 °(336) 878-2260   °[facility] [address], [place] (336) 350-1642, phone; (336) 294-5005, fax Sees patients 1st and 3rd Saturday of every month.  Must not qualify for public or private insurance (i.e. Medicaid, Medicare, [place] Health Choice, Veterans' Benefits) • Household income should be no more than 200% of the poverty level •The clinic cannot treat you if you are pregnant or think you are pregnant • Sexually transmitted diseases are not treated at the clinic.  ° ° °Dental Care: °Organization         Address  Phone  Notes  °[place] [facility] [address], [place] (336) 641-6152 Accepts children up to age 21 who are enrolled in Medicaid or [place] Health Choice; pregnant women with a Medicaid card; and children who have applied for Medicaid or [place] Health Choice, but were declined, whose parents can pay a reduced fee at time of service.  °[place]  [facility] [place]  [address], [place] (336) 641-7733 Accepts children up to age 21 who are enrolled in Medicaid or [place] Health Choice; pregnant women with a Medicaid card; and children who have applied for Medicaid or [place] Health Choice, but were declined, whose parents can pay a reduced fee at time of service.  °[facility] ° [address], [place] (336) 641-4533 Patients are seen by appointment only. Walk-ins are not accepted. [facility] will see patients 18 years of age and older. °Monday - Tuesday (8am-5pm) °Most Wednesdays (8:30-5pm) °$30 per visit, cash only  °[facility] ° [address], [place] (336) 641-4533 Patients are seen by appointment only. Walk-ins are not accepted. [facility] will see patients 18 years of age and older. °One   Wednesday Evening (Monthly: Volunteer Based).  $30 per visit, cash only  °[facility]  (919) 537-3737 for adults; Children under age 4, call Graduate Pediatric Dentistry at ([phone]) [phone]. Children aged 4-14, please call (919) [phone] to request a pediatric application. ° Dental services are provided in all areas of dental care including fillings, crowns and bridges, complete and partial dentures, implants, gum treatment, root canals, and extractions. Preventive care is also provided. Treatment is provided to both adults and children. °Patients are selected via a lottery and there is often a waiting list. °  °[facility] [address], °[place] ° (336) 763-8833 [email or web address] °  °[facility] [address], [place], [place] (336)723-1848, Ext. 123 Second and Fourth Thursday of each month, opens at 6:30 AM; Clinic ends at 9 AM.  Patients are seen on a first-come first-served basis, and a limited number are seen during each clinic.  ° °[facility] ° [address], [place], [place] (336) 723-7904    Eligibility Requirements °You must have lived in [place], [place], or [place] counties for at least the last three months. °  You cannot be eligible for state or federal sponsored healthcare insurance, including Veterans Administration, Medicaid, or Medicare. °  You generally cannot be eligible for healthcare insurance through your employer.  °  How to apply: °Eligibility screenings are held every Tuesday and Wednesday afternoon from 1:00 pm until 4:00 pm. You do not need an appointment for the interview!  °[facility] [address], [place], [place] 336-631-2330   °[facility]  336-342-8273   °[facility]  336-703-3100   °[facility]  336-570-6415   ° °Behavioral Health Resources in the Community: °Intensive Outpatient Programs °Organization         Address  Phone  Notes  °[facility] [address], [place], [place] 336-878-6098   °[facility] [address], [place], [place] 336-832-9800   °[facility] [address], [place], [place] ° 336-882-2125   °[facility] [address],  °[place], [place] 1-800-853-5163 or 336-641-4981   °Substance Abuse Resources °Organization         Address  Phone  Notes  °[facility]  336-882-2125   °[facility]  336-784-9470   °[facility]  336-285-9073   °[facility]  336-845-3988   °Residential & Outpatient Substance Abuse Program  1-800-659-3381   °Psychological Services °Organization         Address  Phone  Notes  °[facility]  336- 832-9600   °[facility]  336- 378-7881   °[facility] [address], [place] 1-800-853-5163 or 336-641-4981   ° °Mobile Crisis Teams °Organization         Address  Phone  Notes  °[facility], Mobile Crisis Care Unit  1-877-626-1772   °Assertive °[facility] ° [address]  [place], [place] 336-834-9664   °Sharon DeEsch [address] °[place] 336-554-5454   ° °Self-Help/Support Groups °Organization         Address  Phone             Notes  °[facility]  - variety of support groups  336- 373-1402 Call for more information  °Narcotics Anonymous (NA), [facility] [address], °[place]  2 meetings at this location  ° °  Residential Treatment Programs Organization         Address  Phone  Notes  [facility] [address] [place] [address],    [place]  [phone]   [facility]  [address], [place] [phone], [place], [place] [phone]   [facility] [address], [place] [phone] Admissions: 8am-3pm M-F  [facility] [address]    [place], [place] [phone]   [facility] [address], [place], [place] [phone]   [facility] [address]  [place], [place] [phone]   [facility] Intensive Outpatient [address], [place], [place] [phone]   [facility] ([facility] [address]  [place], [place] [phone] or (332)[phone](561)[phone]   [facility] [address] [place], [place] [phone] Accepts Medicaid  [facility] [address]  [place] [phone] Substance Abuse/Addiction Treatment   [facility]         Address  Phone  Notes  [facility]  (947) 533-0328(888) [phone]   Angie FavaJulie Brannon, PhD [address], [address] [place], [place]   [phone](336) (450)[phone] or [phone](336) [phone]   [facility]   [address] [place], [place] [phone](336) [phone]   [facility] [address], [place], [place] (312) [phone](336) [phone] Insurance/Medicaid/sponsorship through [facility]  [facility] [address]                                    [place], [place] (504) [phone](336) [phone] Therapy/tele-psych/case    [facility] [address]   [place], [place] [phone]) [phone]    Dr. Lolly MustacheArfeen  [phone]) [phone]   [facility] [place] [facility]  [employer] [facility] 1) [address], [place] 2) [address], [place] 3)  [address], [place] [phone](336) [phone](336) ([phone]  [phone](336) [phone]   [facility] Child Abuse Hot[facility] (438) 210-1501(336) (808)174-8744 or 613-160-5536(336) (587) [phone] (After Hours)      Take your usual prescriptions as previously directed.  Call your regular medical doctor and the Cardiologist on Monday to schedule a follow up appointment this week.  Return to the Emergency Department immediately sooner if worsening.

## 2014-12-04 ENCOUNTER — Emergency Department (HOSPITAL_COMMUNITY): Payer: Self-pay

## 2014-12-04 ENCOUNTER — Emergency Department (HOSPITAL_COMMUNITY)
Admission: EM | Admit: 2014-12-04 | Discharge: 2014-12-04 | Disposition: A | Discharge status: Home / Self Care | Payer: Self-pay | Attending: Emergency Medicine | Admitting: Emergency Medicine

## 2014-12-04 ENCOUNTER — Encounter (HOSPITAL_COMMUNITY): Payer: Self-pay | Admitting: Emergency Medicine

## 2014-12-04 DIAGNOSIS — Z79899 Other long term (current) drug therapy: Secondary | ICD-10-CM | POA: Insufficient documentation

## 2014-12-04 DIAGNOSIS — I484 Atypical atrial flutter: Secondary | ICD-10-CM | POA: Insufficient documentation

## 2014-12-04 DIAGNOSIS — Z720 Tobacco use: Secondary | ICD-10-CM | POA: Insufficient documentation

## 2014-12-04 DIAGNOSIS — R0789 Other chest pain: Secondary | ICD-10-CM | POA: Insufficient documentation

## 2014-12-04 DIAGNOSIS — I10 Essential (primary) hypertension: Secondary | ICD-10-CM | POA: Insufficient documentation

## 2014-12-04 LAB — PROTIME-INR
INR: 0.96 (ref 0.00–1.49)
Prothrombin Time: 13 seconds (ref 11.6–15.2)

## 2014-12-04 LAB — MAGNESIUM: Magnesium: 1.9 mg/dL (ref 1.7–2.4)

## 2014-12-04 LAB — COMPREHENSIVE METABOLIC PANEL
ALT: 19 U/L (ref 17–63)
ANION GAP: 10 (ref 5–15)
AST: 18 U/L (ref 15–41)
Albumin: 4.2 g/dL (ref 3.5–5.0)
Alkaline Phosphatase: 68 U/L (ref 38–126)
BILIRUBIN TOTAL: 0.8 mg/dL (ref 0.3–1.2)
BUN: 6 mg/dL (ref 6–20)
CO2: 23 mmol/L (ref 22–32)
Calcium: 9 mg/dL (ref 8.9–10.3)
Chloride: 105 mmol/L (ref 101–111)
Creatinine, Ser: 0.92 mg/dL (ref 0.61–1.24)
Glucose, Bld: 106 mg/dL — ABNORMAL HIGH (ref 65–99)
POTASSIUM: 3.3 mmol/L — AB (ref 3.5–5.1)
SODIUM: 138 mmol/L (ref 135–145)
Total Protein: 7.5 g/dL (ref 6.5–8.1)

## 2014-12-04 LAB — TROPONIN I

## 2014-12-04 LAB — CBC
HEMATOCRIT: 45.5 % (ref 39.0–52.0)
Hemoglobin: 15.9 g/dL (ref 13.0–17.0)
MCH: 31.8 pg (ref 26.0–34.0)
MCHC: 34.9 g/dL (ref 30.0–36.0)
MCV: 91 fL (ref 78.0–100.0)
Platelets: 194 10*3/uL (ref 150–400)
RBC: 5 MIL/uL (ref 4.22–5.81)
RDW: 13.1 % (ref 11.5–15.5)
WBC: 15.3 10*3/uL — AB (ref 4.0–10.5)

## 2014-12-04 MED ORDER — KETOROLAC TROMETHAMINE 30 MG/ML IJ SOLN
30.0000 mg | Freq: Once | INTRAMUSCULAR | Status: AC
  Administered 2014-12-04: 30 mg via INTRAVENOUS
  Filled 2014-12-04: qty 1

## 2014-12-04 MED ORDER — DEXTROSE 5 % IV SOLN
5.0000 mg/h | INTRAVENOUS | Status: DC
  Administered 2014-12-04: 5 mg/h via INTRAVENOUS
  Filled 2014-12-04: qty 100

## 2014-12-04 MED ORDER — ADENOSINE 6 MG/2ML IV SOLN: 12.0000 mg | Freq: Once | INTRAVENOUS | Status: DC

## 2014-12-04 MED ORDER — ASPIRIN 81 MG PO CHEW
324.0000 mg | CHEWABLE_TABLET | Freq: Once | ORAL | Status: AC
  Administered 2014-12-04: 324 mg via ORAL
  Filled 2014-12-04: qty 4

## 2014-12-04 MED ORDER — LORAZEPAM 2 MG/ML IJ SOLN
1.0000 mg | Freq: Once | INTRAMUSCULAR | Status: AC
  Administered 2014-12-04: 1 mg via INTRAVENOUS
  Filled 2014-12-04: qty 1

## 2014-12-04 MED ORDER — DILTIAZEM LOAD VIA INFUSION
20.0000 mg | Freq: Once | INTRAVENOUS | Status: AC
  Administered 2014-12-04: 20 mg via INTRAVENOUS
  Filled 2014-12-04: qty 20

## 2014-12-04 NOTE — ED Notes (Addendum)
Patient c/o left sided chest pain that radiates into left shoulder. Denies any shortness of breath, dizziness, or nausea. Patient denies any cardiac hx. Denies taking any medications today. Patient in SVT when placed on monitor-per patient has had this in past, refuses to talk to nursing staff. Patient requesting to talk to doctor in private.

## 2014-12-04 NOTE — ED Provider Notes (Signed)
CSN: [id number]     Arrival date & time 12/04/14  1511 History   First MD Initiated Contact with Patient 12/04/14 1520     Chief Complaint  Patient presents with  . Chest Pain     (Consider location/radiation/quality/duration/timing/severity/associated sxs/prior Treatment) HPI Patient presents with chest pain. Pain is left-sided superior, radiating towards the left shoulder. Pain began soon after the patient used cocaine, which occurred just after he used Viagra.   The patient was in his usual state of health, with no pain. Patient notes he has had similar prior events, including one within the past month. Patient has not seen her primary care between that interval and today. Patient smokes, drinks, uses cocaine.  Since onset pain has been persistent, with no clear alleviating or exacerbating factors. Pain is severe, sharp.   Smoking cessation provided, particularly in light of this patient's evaluation in the ED.  Past Medical History  Diagnosis Date  . Hypertension   . Cocaine abuse    History reviewed. No pertinent past surgical history. History reviewed. No pertinent family history. History  Substance Use Topics  . Smoking status: Current Every Day Smoker -- 1.00 packs/day    Types: Cigarettes  . Smokeless tobacco: Never Used  . Alcohol Use: Yes     Comment: social    Review of Systems  Constitutional:       Per HPI, otherwise negative  HENT:       Per HPI, otherwise negative  Respiratory:       Per HPI, otherwise negative  Cardiovascular:       Per HPI, otherwise negative  Gastrointestinal: Negative for vomiting.  Endocrine:       Negative aside from HPI  Genitourinary:       Neg aside from HPI   Musculoskeletal:       Per HPI, otherwise negative  Skin: Negative.   Neurological: Negative for syncope.      Allergies  Iodine and Shellfish allergy  Home Medications   Prior to Admission medications   Medication Sig Start Date End Date Taking?  Authorizing Provider  Chlorpheniramine Maleate (ALLERGY PO) Take 1 tablet by mouth daily.    Historical Provider, MD  cyclobenzaprine (FLEXERIL) 10 MG tablet Take 1 tablet (10 mg total) by mouth 2 (two) times daily as needed for muscle spasms. 11/01/14   Donnetta HutchingBrian Cook, MD  metoprolol tartrate (LOPRESSOR) 25 MG tablet Take 25 mg by mouth 2 (two) times daily.    Historical Provider, MD  naproxen (NAPROSYN) 500 MG tablet Take 1 tablet (500 mg total) by mouth 2 (two) times daily. 11/01/14   Donnetta HutchingBrian Cook, MD  Ranitidine HCl (ACID REDUCER PO) Take 1 tablet by mouth daily.    Historical Provider, MD  sildenafil (REVATIO) 20 MG tablet Take 20 mg by mouth 3 (three) times daily.    Historical Provider, MD   BP 162/86 mmHg  Pulse 146  Temp(Src) 98.9 F (37.2 C) (Oral)  Resp 18 Physical Exam  Constitutional: He is oriented to person, place, and time. He appears well-developed. No distress.  HENT:  Head: Normocephalic and atraumatic.  Eyes: Conjunctivae and EOM are normal.  Cardiovascular: Regular rhythm and intact distal pulses.  Tachycardia present.   Pulmonary/Chest: Effort normal. No stridor. No respiratory distress.  Abdominal: He exhibits no distension.  Musculoskeletal: He exhibits no edema.  Neurological: He is alert and oriented to person, place, and time.  Skin: Skin is warm and dry.  Psychiatric: He has a normal mood and  affect.  Nursing note and vitals reviewed.   ED Course  Procedures (including critical care time) Labs Review Labs Reviewed  CBC - Abnormal; Notable for the following:    WBC 15.3 (*)    All other components within normal limits  COMPREHENSIVE METABOLIC PANEL - Abnormal; Notable for the following:    Potassium 3.3 (*)    Glucose, Bld 106 (*)    All other components within normal limits  MAGNESIUM  PROTIME-INR  TROPONIN I    Imaging Review Dg Chest Portable 1 View  12/04/2014   CLINICAL DATA:  Left-sided chest pain radiating into the left shoulder. Smoker. History of  hypertension. Initial encounter.  EXAM: PORTABLE CHEST - 1 VIEW  COMPARISON:  Radiographs 11/12/2014.  CT 10/02/2014.  FINDINGS: 1540 hours. The heart size and mediastinal contours are normal. The lungs are clear. There is no pleural effusion or pneumothorax. No acute osseous findings are identified. Telemetry leads overlie the chest.  IMPRESSION: No active cardiopulmonary process.   Electronically Signed   By: Carey Bullocks M.D.   On: 12/04/2014 16:06     EKG Interpretation   Date/Time:  Sunday December 04 2014 15:16:46 EDT Ventricular Rate:  145 PR Interval:  116 QRS Duration: 91 QT Interval:  387 QTC Calculation: 601 R Axis:   81 Text Interpretation:  Sinus tachycardia or atrial flutter with 2/1 block  Prolonged QT interval No  significant change since last tracing other than longer QT Confirmed by  POLLINA  MD, CHRISTOPHER [phone]) on 12/04/2014 3:35:19 PM     On monitor the patient has a rhythm 140s, regular, abnormal Pulse ox symmetry is 97% on room air normal   Medially after the initial evaluation patient received IV fluids, diltiazem bolus, drip, for likely atrial flutter.   5:33 PM Patient now in sinus rhythm, rate 95, states that he feels better, no longer feels palpitations.    EKG Interpretation  Date/Time:  Sunday December 04 2014 17:48:59 EDT Ventricular Rate:  98 PR Interval:  200 QRS Duration: 94 QT Interval:  341 QTC Calculation: 435 R Axis:   81 Text Interpretation:  Sinus rhythm Borderline prolonged PR interval ST elev, probable normal early repol pattern Sinus rhythm Early repolarization pattern ST-t wave abnormality Abnormal ekg Confirmed by Gerhard Munch  MD [phone]) on 12/04/2014 9:08:55 PM        MDM   Patient presents after the onset of chest pain following use of cocaine and Viagra. Here patient is initially tachycardic, diaphoretic, very uncomfortable appearing. Patient's initial rhythm was narrow complex tachycardia, consistent with atrial    flutter versus SVT.  patient was started on Cardizem bolus, drip. Patient tolerated this well, and after being provided fluids, benzodiazepine, analgesia,  he had resolution of his pain.   Following additional resuscitation, patient had conversion to normal sinus rhythm. After hours of monitoring, with no decompensation, no recurrence of his chest pain, he was discharged in stable condition. Patient was made aware of the availability of resources to assist with his cocaine abuse, encouraged to no longer use cocaine.   CRITICAL CARE Performed by: Gerhard Munch Total critical care time: 40 Critical care time was exclusive of separately billable procedures and treating other patients. Critical care was necessary to treat or prevent imminent or life-threatening deterioration. Critical care was time spent personally by me on the following activities: development of treatment plan with patient and/or surrogate as well as nursing, discussions with consultants, evaluation of patient's response to treatment, examination of patient, obtaining  history from patient or surrogate, ordering and performing treatments and interventions, ordering and review of laboratory studies, ordering and review of radiographic studies, pulse oximetry and re-evaluation of patient's condition.   Gerhard Munch, MD 12/04/14 2109

## 2014-12-04 NOTE — Discharge Instructions (Signed)
As discussed, it is very important that you do not use cocaine, and that you follow-up with our cardiologist's for further evaluation of today's episode of atrial flutter. Return here for concerning changes in your condition.

## 2014-12-04 NOTE — ED Notes (Signed)
Pt called out stating he was feeling lightheaded. HR 155, BP 196/66. Reassured pt it was probably related to high HR.

## 2014-12-09 ENCOUNTER — Emergency Department (HOSPITAL_COMMUNITY)
Admission: EM | Admit: 2014-12-09 | Discharge: 2014-12-10 | Disposition: A | Discharge status: Home / Self Care | Payer: Self-pay | Attending: Emergency Medicine | Admitting: Emergency Medicine

## 2014-12-09 ENCOUNTER — Encounter (HOSPITAL_COMMUNITY): Payer: Self-pay | Admitting: *Deleted

## 2014-12-09 DIAGNOSIS — R0789 Other chest pain: Secondary | ICD-10-CM | POA: Insufficient documentation

## 2014-12-09 DIAGNOSIS — Z791 Long term (current) use of non-steroidal anti-inflammatories (NSAID): Secondary | ICD-10-CM | POA: Insufficient documentation

## 2014-12-09 DIAGNOSIS — Z79899 Other long term (current) drug therapy: Secondary | ICD-10-CM | POA: Insufficient documentation

## 2014-12-09 DIAGNOSIS — I10 Essential (primary) hypertension: Secondary | ICD-10-CM | POA: Insufficient documentation

## 2014-12-09 DIAGNOSIS — R079 Chest pain, unspecified: Secondary | ICD-10-CM

## 2014-12-09 DIAGNOSIS — F1410 Cocaine abuse, uncomplicated: Secondary | ICD-10-CM | POA: Insufficient documentation

## 2014-12-09 DIAGNOSIS — Z720 Tobacco use: Secondary | ICD-10-CM | POA: Insufficient documentation

## 2014-12-09 MED ORDER — KETOROLAC TROMETHAMINE 60 MG/2ML IM SOLN
60.0000 mg | Freq: Once | INTRAMUSCULAR | Status: AC
  Administered 2014-12-10: 60 mg via INTRAMUSCULAR
  Filled 2014-12-09: qty 2

## 2014-12-09 NOTE — ED Notes (Addendum)
Pt c/o chest pain x 1 hr. Pt states he took a viagra a couple of hours ago and snorted some coke. Pt states the chest pain is constant. Pt also c/o left arm numbness and blurred vision.

## 2014-12-09 NOTE — ED Notes (Signed)
Pt refusing ekg in triage. Pt states he will have it done while he is in his room.

## 2014-12-09 NOTE — ED Provider Notes (Signed)
CSN: [id number]     Arrival date & time 12/09/14  2223 History  This chart was scribed for Geoffery Lyons, MD by Karle Plumber, ED Scribe. This patient was seen in room APA09/APA09 and the patient's care was started at 11:55 PM.  Chief Complaint  Patient presents with  . Chest Pain   The history is provided by the patient and medical records. No language interpreter was used.    HPI Comments:  Randy Romero is a 39 y.o. male with PMHx of HTN and cocaine abuse who presents to the Emergency Department complaining of left-sided chest pain that began approximately one hour PTA. He describes the pain as constant pressure and aching and rates is 5/10. He reports snorting cocaine and took a Viagra simultaneously prior to the CP beginning. He states he has done this in the past without issue. Denies any past cardiac issues but reports some mild chest pains with exertion. He has not done anything to treat his symptoms. Deep breathing and palpation makes the pain worse. Denies alleviating factors. Denies leg swelling, fever, chills, nausea or vomiting.   Past Medical History  Diagnosis Date  . Hypertension   . Cocaine abuse    History reviewed. No pertinent past surgical history. History reviewed. No pertinent family history. History  Substance Use Topics  . Smoking status: Current Every Day Smoker -- 1.00 packs/day    Types: Cigarettes  . Smokeless tobacco: Never Used  . Alcohol Use: Yes     Comment: social    Review of Systems  Constitutional: Negative for fever and chills.  Cardiovascular: Positive for chest pain. Negative for leg swelling.  Gastrointestinal: Negative for nausea and vomiting.  All other systems reviewed and are negative.   Allergies  Iodine and Shellfish allergy  Home Medications   Prior to Admission medications   Medication Sig Start Date End Date Taking? Authorizing Provider  acetaminophen (TYLENOL) 325 MG tablet Take 650 mg by mouth every 6 (six) hours as needed  for mild pain.    Historical Provider, MD  Chlorpheniramine Maleate (ALLERGY RELIEF PO) Take 1 tablet by mouth daily.    Historical Provider, MD  cyclobenzaprine (FLEXERIL) 10 MG tablet Take 1 tablet (10 mg total) by mouth 2 (two) times daily as needed for muscle spasms. 11/01/14   Donnetta Hutching, MD  metoprolol tartrate (LOPRESSOR) 25 MG tablet Take 25 mg by mouth 2 (two) times daily.    Historical Provider, MD  naproxen (NAPROSYN) 500 MG tablet Take 1 tablet (500 mg total) by mouth 2 (two) times daily. 11/01/14   Donnetta Hutching, MD   Triage Vitals: BP 154/90 mmHg  Pulse 108  Temp(Src) 98.6 F (37 C)  Resp 20  Ht 6\' 1"  (1.854 m)  Wt 215 lb (97.523 kg)  BMI 28.37 kg/m2  SpO2 99% Physical Exam  Constitutional: He is oriented to person, place, and time. He appears well-developed and well-nourished.  HENT:  Head: Normocephalic and atraumatic.  Eyes: EOM are normal.  Neck: Normal range of motion.  Cardiovascular: Normal rate, regular rhythm and normal heart sounds.  Exam reveals no gallop and no friction rub.   No murmur heard. Pulmonary/Chest: Effort normal and breath sounds normal. No respiratory distress. He has no wheezes. He has no rales. He exhibits tenderness.  Mild TTP to anterior chest wall.  Abdominal: Soft. There is no tenderness.  Musculoskeletal: Normal range of motion.  Neurological: He is alert and oriented to person, place, and time.  Skin: Skin is warm and  dry.  Psychiatric: He has a normal mood and affect. His behavior is normal.  Nursing note and vitals reviewed.   ED Course  Procedures (including critical care time) DIAGNOSTIC STUDIES: Oxygen Saturation is 99% on RA, normal by my interpretation.   COORDINATION OF CARE: 11:59 PM- Will order lab work. Pt verbalizes understanding and agrees to plan.  Medications - No data to display  Labs Review Labs Reviewed - No data to display  Imaging Review No results found.   EKG Interpretation   Date/Time:  Friday December 09 2014 22:47:25 EDT Ventricular Rate:  106 PR Interval:  174 QRS Duration: 86 QT Interval:  332 QTC Calculation: 441 R Axis:   63 Text Interpretation:  Sinus tachycardia Otherwise normal ECG Confirmed by  Nohely Whitehorn  MD, Naphtali Riede ([id number]) on 12/10/2014 2:52:02 AM      MDM   Final diagnoses:  None    Patient presents with complaints of chest pain following the use of cocaine and Viagra. His workup reveals an unchanged EKG and negative troponin 2. I highly doubt a cardiac etiology and feel as though he is appropriate for discharge. Review of his chart reveals this is the fourth time he has presented in the past 2 months for chest pain involving cocaine use. Patient was advised against doing this in the future.  I personally performed the services described in this documentation, which was scribed in my presence. The recorded information has been reviewed and is accurate.    Geoffery Lyons, MD 12/10/14 [phone]

## 2014-12-10 ENCOUNTER — Emergency Department (HOSPITAL_COMMUNITY): Payer: Self-pay

## 2014-12-10 LAB — COMPREHENSIVE METABOLIC PANEL
ALT: 27 U/L (ref 17–63)
AST: 26 U/L (ref 15–41)
Albumin: 4.7 g/dL (ref 3.5–5.0)
Alkaline Phosphatase: 56 U/L (ref 38–126)
Anion gap: 11 (ref 5–15)
BUN: 8 mg/dL (ref 6–20)
CALCIUM: 9.1 mg/dL (ref 8.9–10.3)
CO2: 24 mmol/L (ref 22–32)
Chloride: 103 mmol/L (ref 101–111)
Creatinine, Ser: 0.97 mg/dL (ref 0.61–1.24)
GFR calc Af Amer: >60 mL/min (ref >60)
GFR calc non Af Amer: >60 mL/min (ref >60)
Glucose, Bld: 98 mg/dL (ref 65–99)
Potassium: 3.8 mmol/L (ref 3.5–5.1)
Sodium: 138 mmol/L (ref 135–145)
Total Bilirubin: 0.7 mg/dL (ref 0.3–1.2)
Total Protein: 7.9 g/dL (ref 6.5–8.1)

## 2014-12-10 LAB — CBC WITH DIFFERENTIAL/PLATELET
Basophils Absolute: 0.1 10*3/uL (ref 0.0–0.1)
Basophils Relative: 1 % (ref 0–1)
EOS PCT: 2 % (ref 0–5)
Eosinophils Absolute: 0.2 10*3/uL (ref 0.0–0.7)
HEMATOCRIT: 44.6 % (ref 39.0–52.0)
Hemoglobin: 15.6 g/dL (ref 13.0–17.0)
Lymphocytes Relative: 17 % (ref 12–46)
Lymphs Abs: 1.6 10*3/uL (ref 0.7–4.0)
MCH: 31.8 pg (ref 26.0–34.0)
MCHC: 35 g/dL (ref 30.0–36.0)
MCV: 90.8 fL (ref 78.0–100.0)
MONO ABS: 0.7 10*3/uL (ref 0.1–1.0)
MONOS PCT: 7 % (ref 3–12)
NEUTROS PCT: 73 % (ref 43–77)
Neutro Abs: 6.9 10*3/uL (ref 1.7–7.7)
PLATELETS: 200 10*3/uL (ref 150–400)
RBC: 4.91 MIL/uL (ref 4.22–5.81)
RDW: 13.1 % (ref 11.5–15.5)
WBC: 9.4 10*3/uL (ref 4.0–10.5)

## 2014-12-10 LAB — TROPONIN I: Troponin I: <0.03 ng/mL (ref <0.031)

## 2014-12-10 NOTE — Discharge Instructions (Signed)
Refrain from further use of cocaine.  Follow-up with your primary Dr. in the next week.   Chest Pain (Nonspecific) It is often hard to give a specific diagnosis for the cause of chest pain. There is always a chance that your pain could be related to something serious, such as a heart attack or a blood clot in the lungs. You need to follow up with your health care provider for further evaluation. CAUSES   Heartburn.  Pneumonia or bronchitis.  Anxiety or stress.  Inflammation around your heart (pericarditis) or lung (pleuritis or pleurisy).  A blood clot in the lung.  A collapsed lung (pneumothorax). It can develop suddenly on its own (spontaneous pneumothorax) or from trauma to the chest.  Shingles infection (herpes zoster virus). The chest wall is composed of bones, muscles, and cartilage. Any of these can be the source of the pain.  The bones can be bruised by injury.  The muscles or cartilage can be strained by coughing or overwork.  The cartilage can be affected by inflammation and become sore (costochondritis). DIAGNOSIS  Lab tests or other studies may be needed to find the cause of your pain. Your health care provider may have you take a test called an ambulatory electrocardiogram (ECG). An ECG records your heartbeat patterns over a 24-hour period. You may also have other tests, such as:  Transthoracic echocardiogram (TTE). During echocardiography, sound waves are used to evaluate how blood flows through your heart.  Transesophageal echocardiogram (TEE).  Cardiac monitoring. This allows your health care provider to monitor your heart rate and rhythm in real time.  Holter monitor. This is a portable device that records your heartbeat and can help diagnose heart arrhythmias. It allows your health care provider to track your heart activity for several days, if needed.  Stress tests by exercise or by giving medicine that makes the heart beat faster. TREATMENT   Treatment  depends on what may be causing your chest pain. Treatment may include:  Acid blockers for heartburn.  Anti-inflammatory medicine.  Pain medicine for inflammatory conditions.  Antibiotics if an infection is present.  You may be advised to change lifestyle habits. This includes stopping smoking and avoiding alcohol, caffeine, and chocolate.  You may be advised to keep your head raised (elevated) when sleeping. This reduces the chance of acid going backward from your stomach into your esophagus. Most of the time, nonspecific chest pain will improve within 2-3 days with rest and mild pain medicine.  HOME CARE INSTRUCTIONS   If antibiotics were prescribed, take them as directed. Finish them even if you start to feel better.  For the next few days, avoid physical activities that bring on chest pain. Continue physical activities as directed.  Do not use any tobacco products, including cigarettes, chewing tobacco, or electronic cigarettes.  Avoid drinking alcohol.  Only take medicine as directed by your health care provider.  Follow your health care provider's suggestions for further testing if your chest pain does not go away.  Keep any follow-up appointments you made. If you do not go to an appointment, you could develop lasting (chronic) problems with pain. If there is any problem keeping an appointment, call to reschedule. SEEK MEDICAL CARE IF:   Your chest pain does not go away, even after treatment.  You have a rash with blisters on your chest.  You have a fever. SEEK IMMEDIATE MEDICAL CARE IF:   You have increased chest pain or pain that spreads to your arm, neck, jaw,  back, or abdomen.  You have shortness of breath.  You have an increasing cough, or you cough up blood.  You have severe back or abdominal pain.  You feel nauseous or vomit.  You have severe weakness.  You faint.  You have chills. This is an emergency. Do not wait to see if the pain will go away. Get  medical help at once. Call your local emergency services (911 in [place] Do not drive yourself to the hospital. MAKE SURE YOU:   Understand these instructions.  Will watch your condition.  Will get help right away if you are not doing well or get worse. Document Released: 03/27/2005 Document Revised: 06/22/2013 Document Reviewed: 01/21/2008 [facility] Patient Information 2015 [place], [place]. This information is not intended to replace advice given to you by your health care provider. Make sure you discuss any questions you have with your health care provider.  Cocaine Cocaine stimulates the central nervous system. As a stimulant, cocaine has the ability to improve athletic performance through increasing speed, endurance, and concentration, as well as decreasing fatigue. Although cocaine may seem to be beneficial for athletics, it is highly addicting and has many debilitating side effects. Cocaine has caused the deaths of many athletes, and its use is banned by every major athletic organization in the world. The clinical effect of cocaine (the high) is very short in duration. Cocaine works in the brain by altering the normal concentrations of chemicals that stimulate the brain cells.  WHY ATHLETES USE IT  Many athletes use cocaine for its central nervous system stimulating properties. It is also used as a recreational drug due to the euphoric felling it produces.  ADVERSE EFFECTS   Sleep disturbances.  Abnormal heart rhythms.  Stroke.  Heart attack.  Seizures.  Elevated blood pressure.  Death.  Paranoia (feeling that people want to hurt you).  Panic attacks (sudden feelings of anxiety or shortness of breath).  Suicidal behavior (wanting to kill yourself).  Homicidal behavior (wanting to kill other people).  Depression (feeling very sad, having decreased energy for activities).  Poor athletic performance. PHARMACOLOGY  Cocaine acts on the body for a short period of time; the  clinical effects may last less than1 hour. Since most athletic competitions last for more than 1 hour, cocaine use may not improve athletic performance. The use of cocaine makes individuals much more susceptible for serious conditions such as seizures, arrhythmia (irregular heart beat), and strokes. Even a single dose of cocaine can be detected on a drug test for up to about 30 hours.  PREVENTION Most athletes use cocaine as a recreational drug and not for the purpose of enhancing athletic performance. To prevent the use of cocaine, athletes must be educated on its side effects and the risk of addiction. If an athlete is found using cocaine, counseling and treatment are almost always required.  Document Released: 06/17/2005 Document Revised: 09/09/2011 Document Reviewed: 09/29/2008 [employer] Patient Information 2015 [place], [place]. This information is not intended to replace advice given to you by your health care provider. Make sure you discuss any questions you have with your health care provider.

## 2014-12-10 NOTE — ED Notes (Signed)
Gave patient Randy Romero and drink

## 2015-09-10 IMAGING — DX DG CHEST 1V PORT
1 series · 2 of 2 positions shown · non-contrast
Comparison: 09/18/2014

CLINICAL DATA: Chest pain.  Hypertension.  Tobacco use.

EXAM:
PORTABLE CHEST - 1 VIEW

[Series 1: AP · U · 2 of 2 slices shown]
[im 1/2]
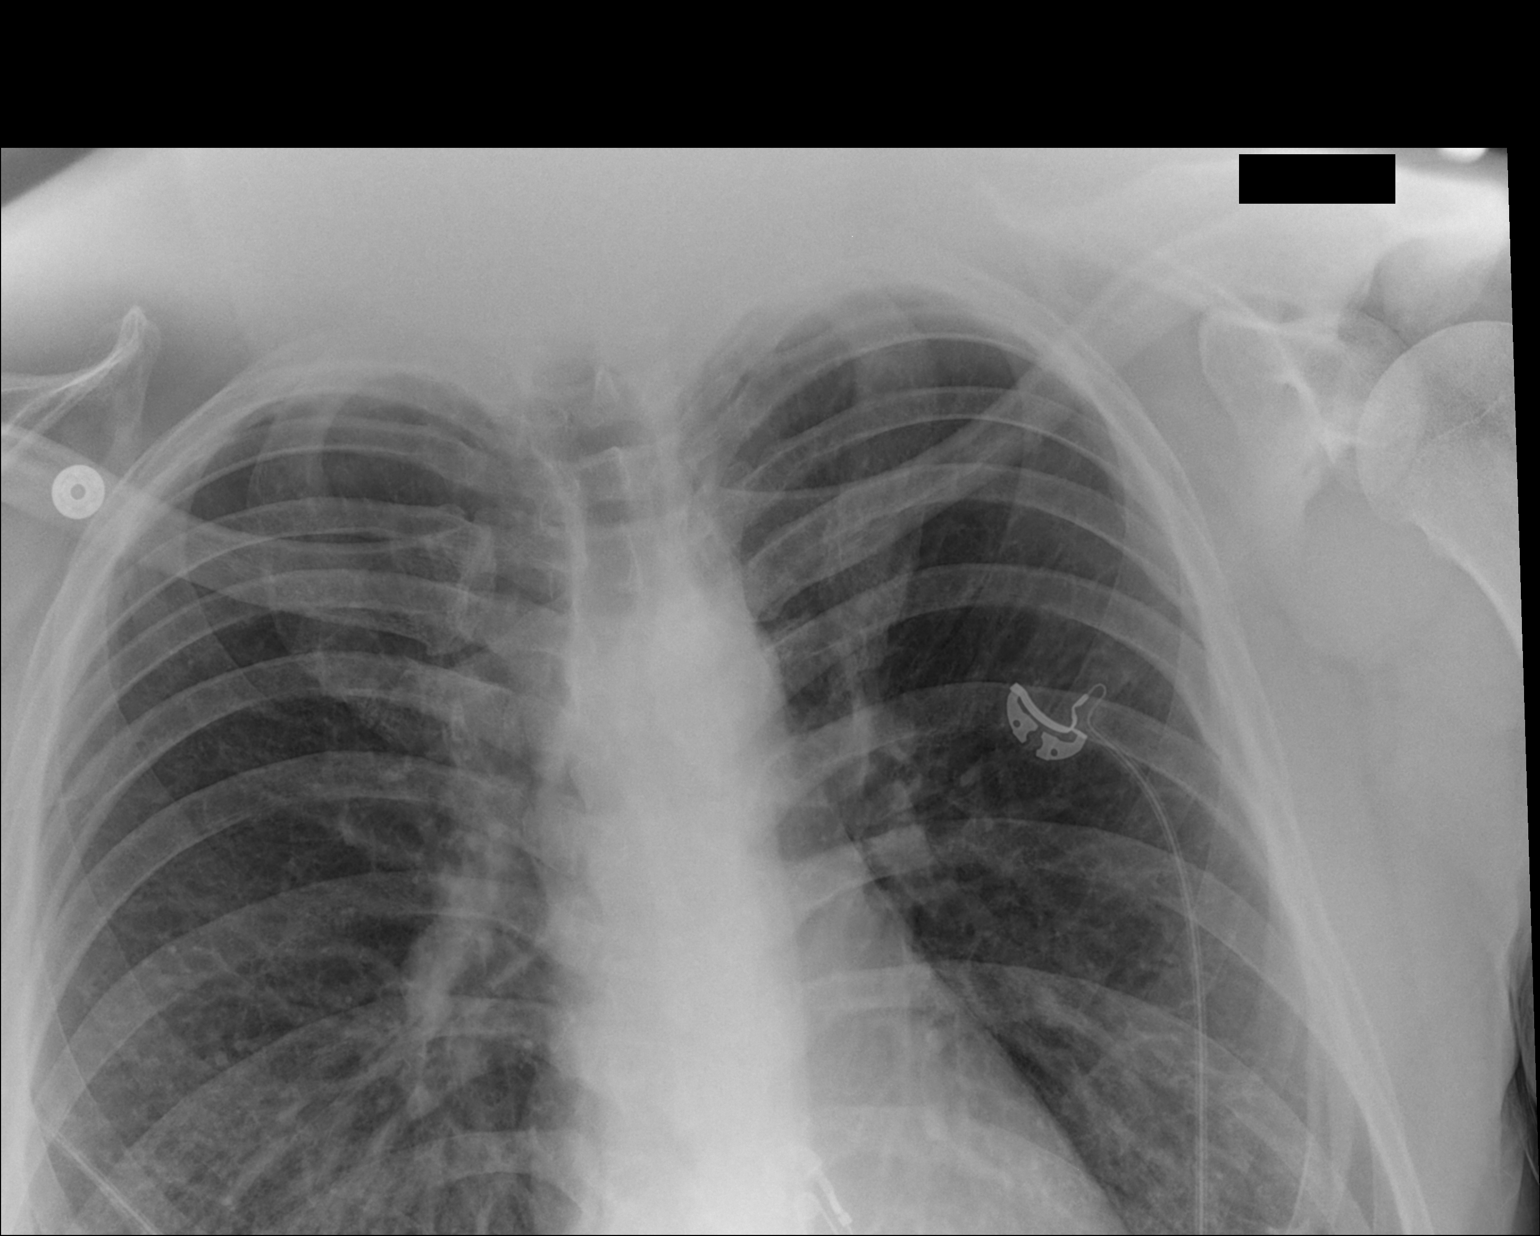
[im 2/2]
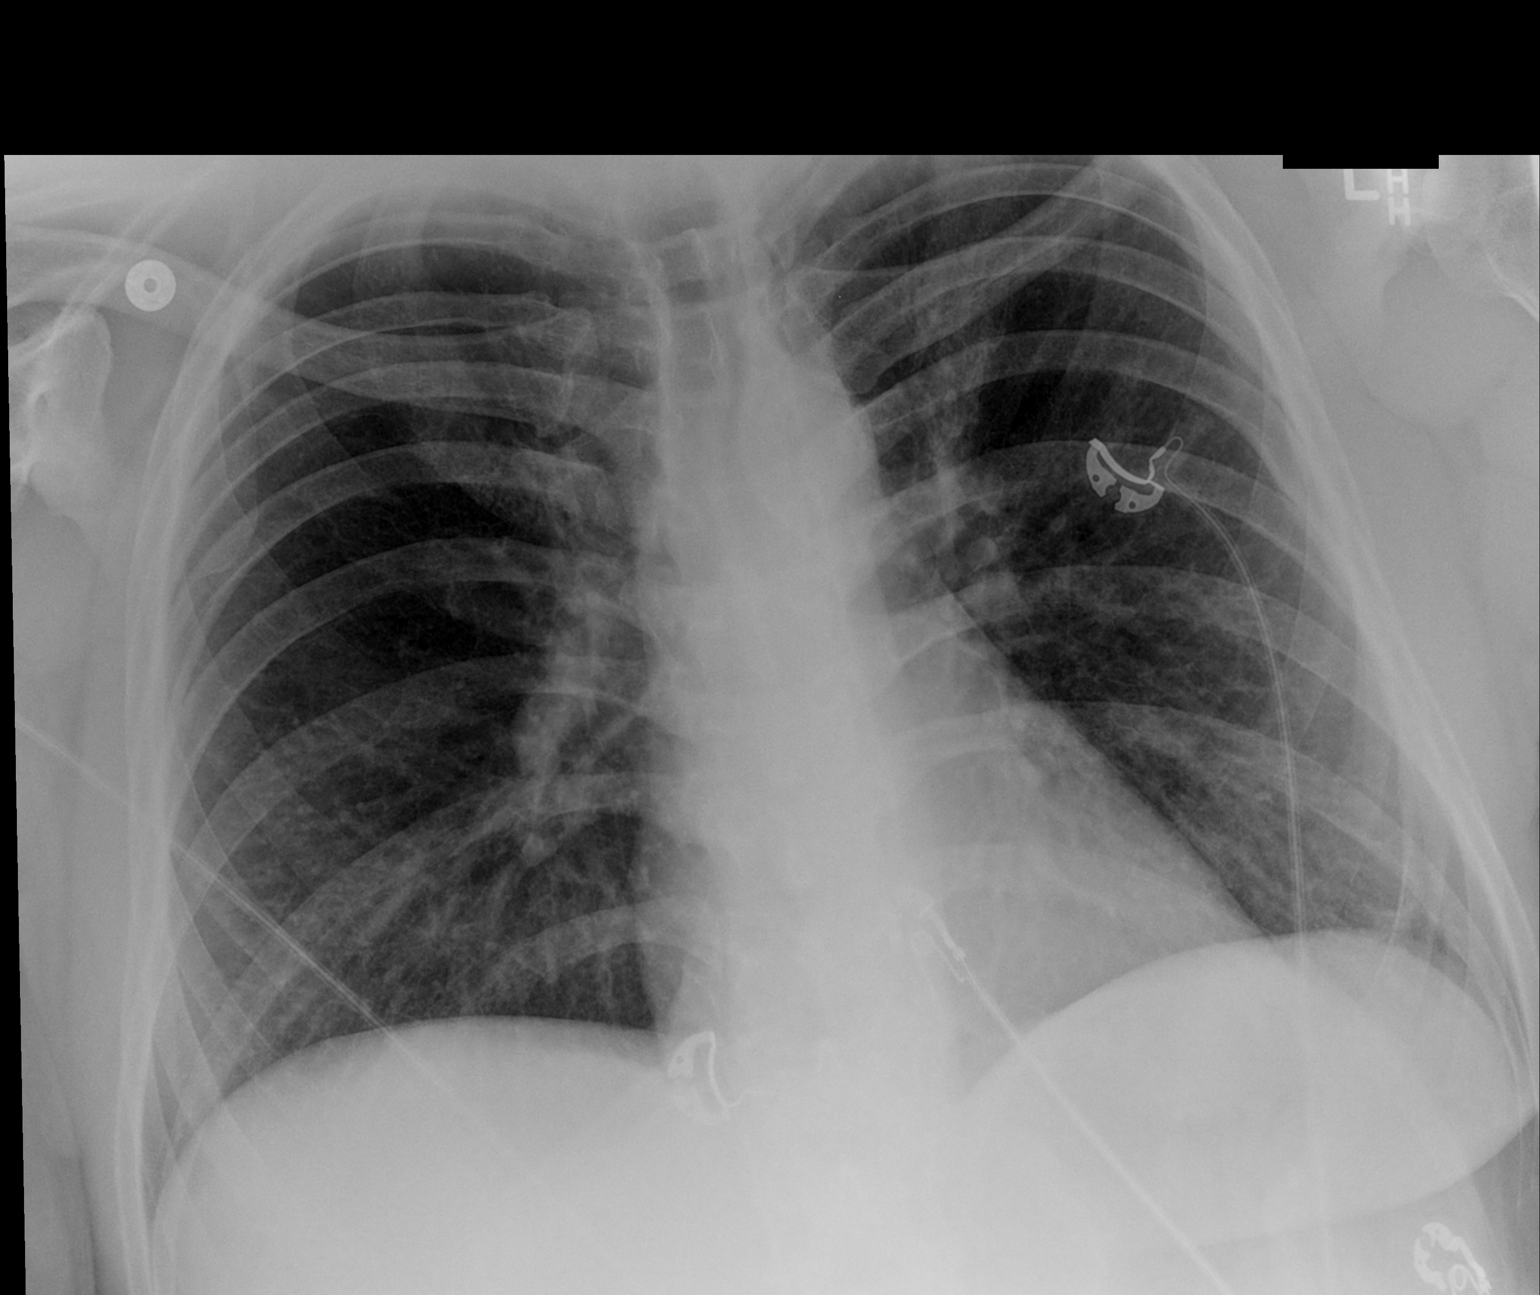

[2 of 2 positions shown; findings below may reference images not displayed]

FINDINGS: Hazy density along the lower neck and upper chest margins probably
technique related, but correlate with any swelling in this vicinity
in determining whether further workup is warranted.

Lungs clear. Cardiac and mediastinal margins appear normal. No
pleural effusion.
IMPRESSION: 1. High density over the lower neck and top of the chest is thought
to probably be artifactual or technique related. Correlate with any
swelling in this region in determining whether further imaging
workup of the neck is warranted.
2. Otherwise negative exam.

## 2015-11-18 IMAGING — DX DG CHEST 2V
2 series · 2 of 2 positions shown · non-contrast
Comparison: Chest radiograph performed 12/04/2014

CLINICAL DATA: Constant chest pain. Left arm numbness and blurred
vision. Initial encounter.

EXAM:
CHEST  2 VIEW

[chest lat]
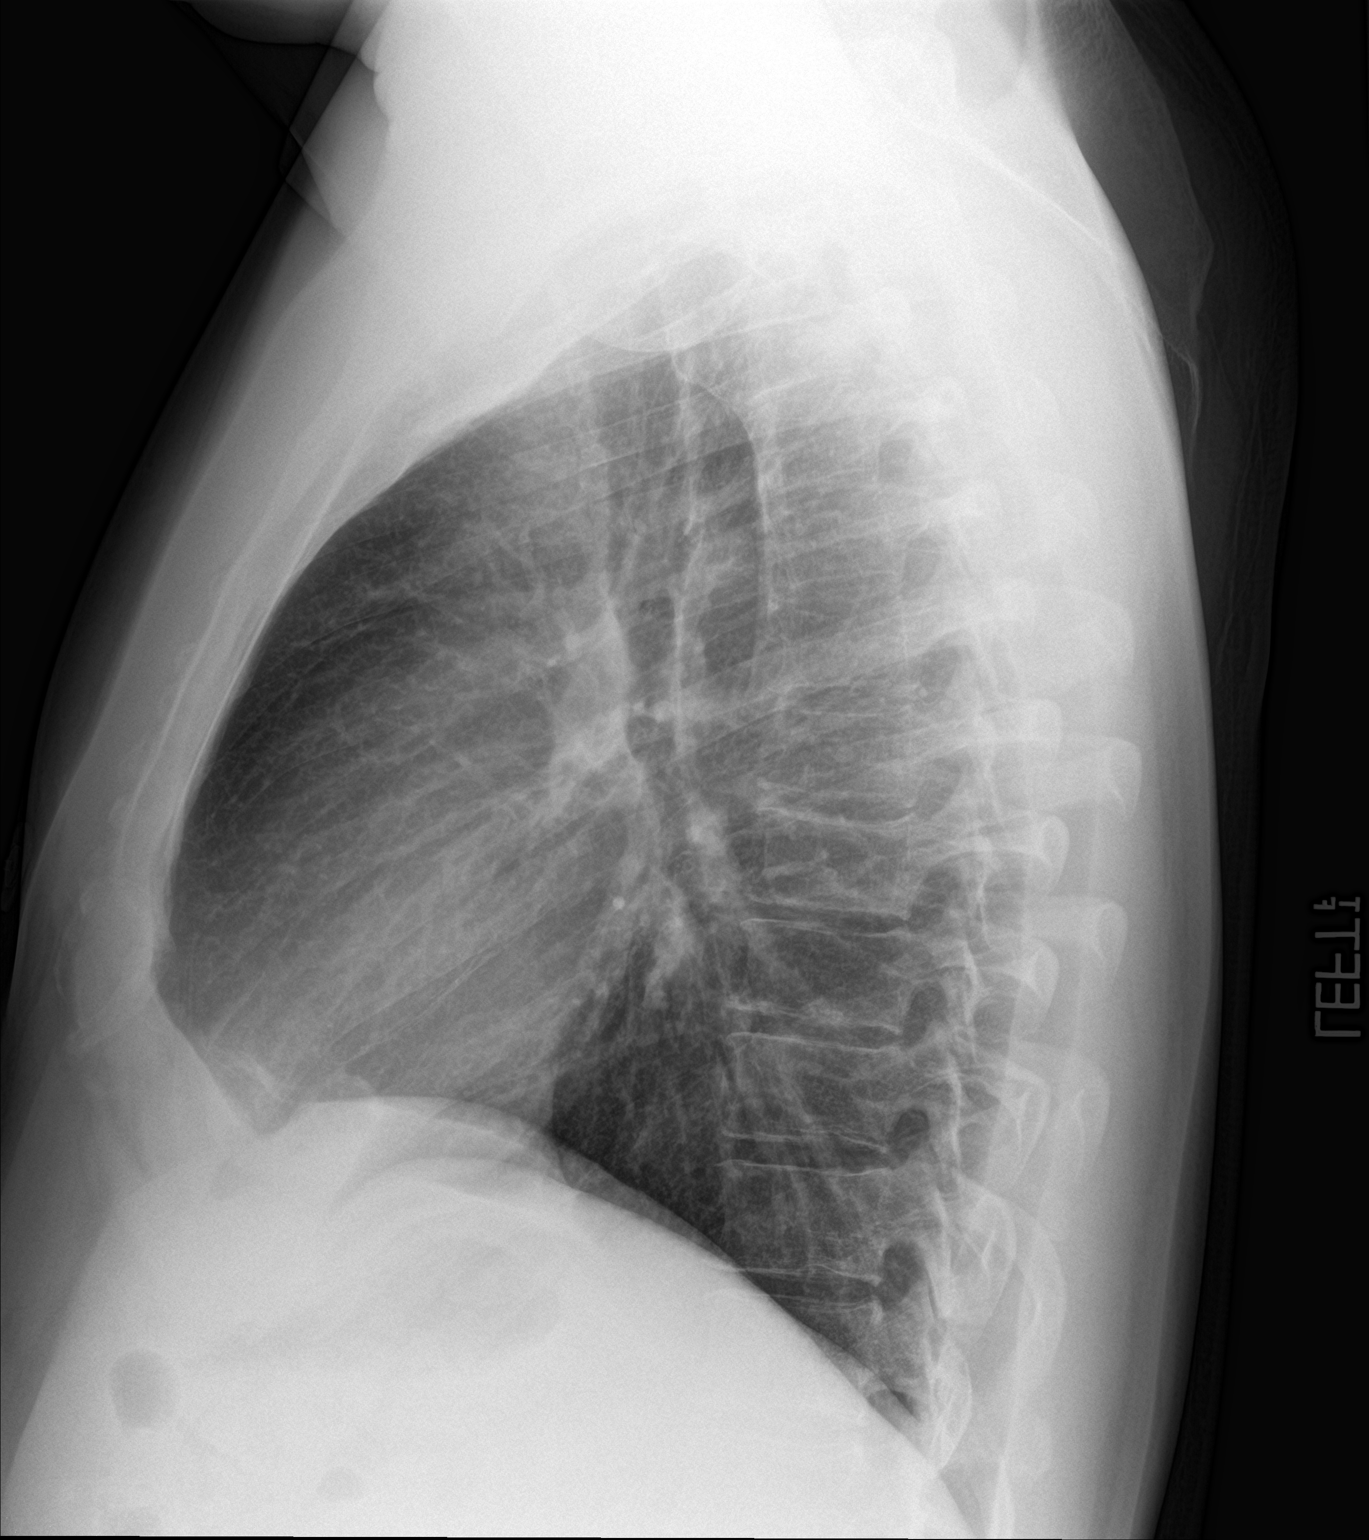

[chest pa]
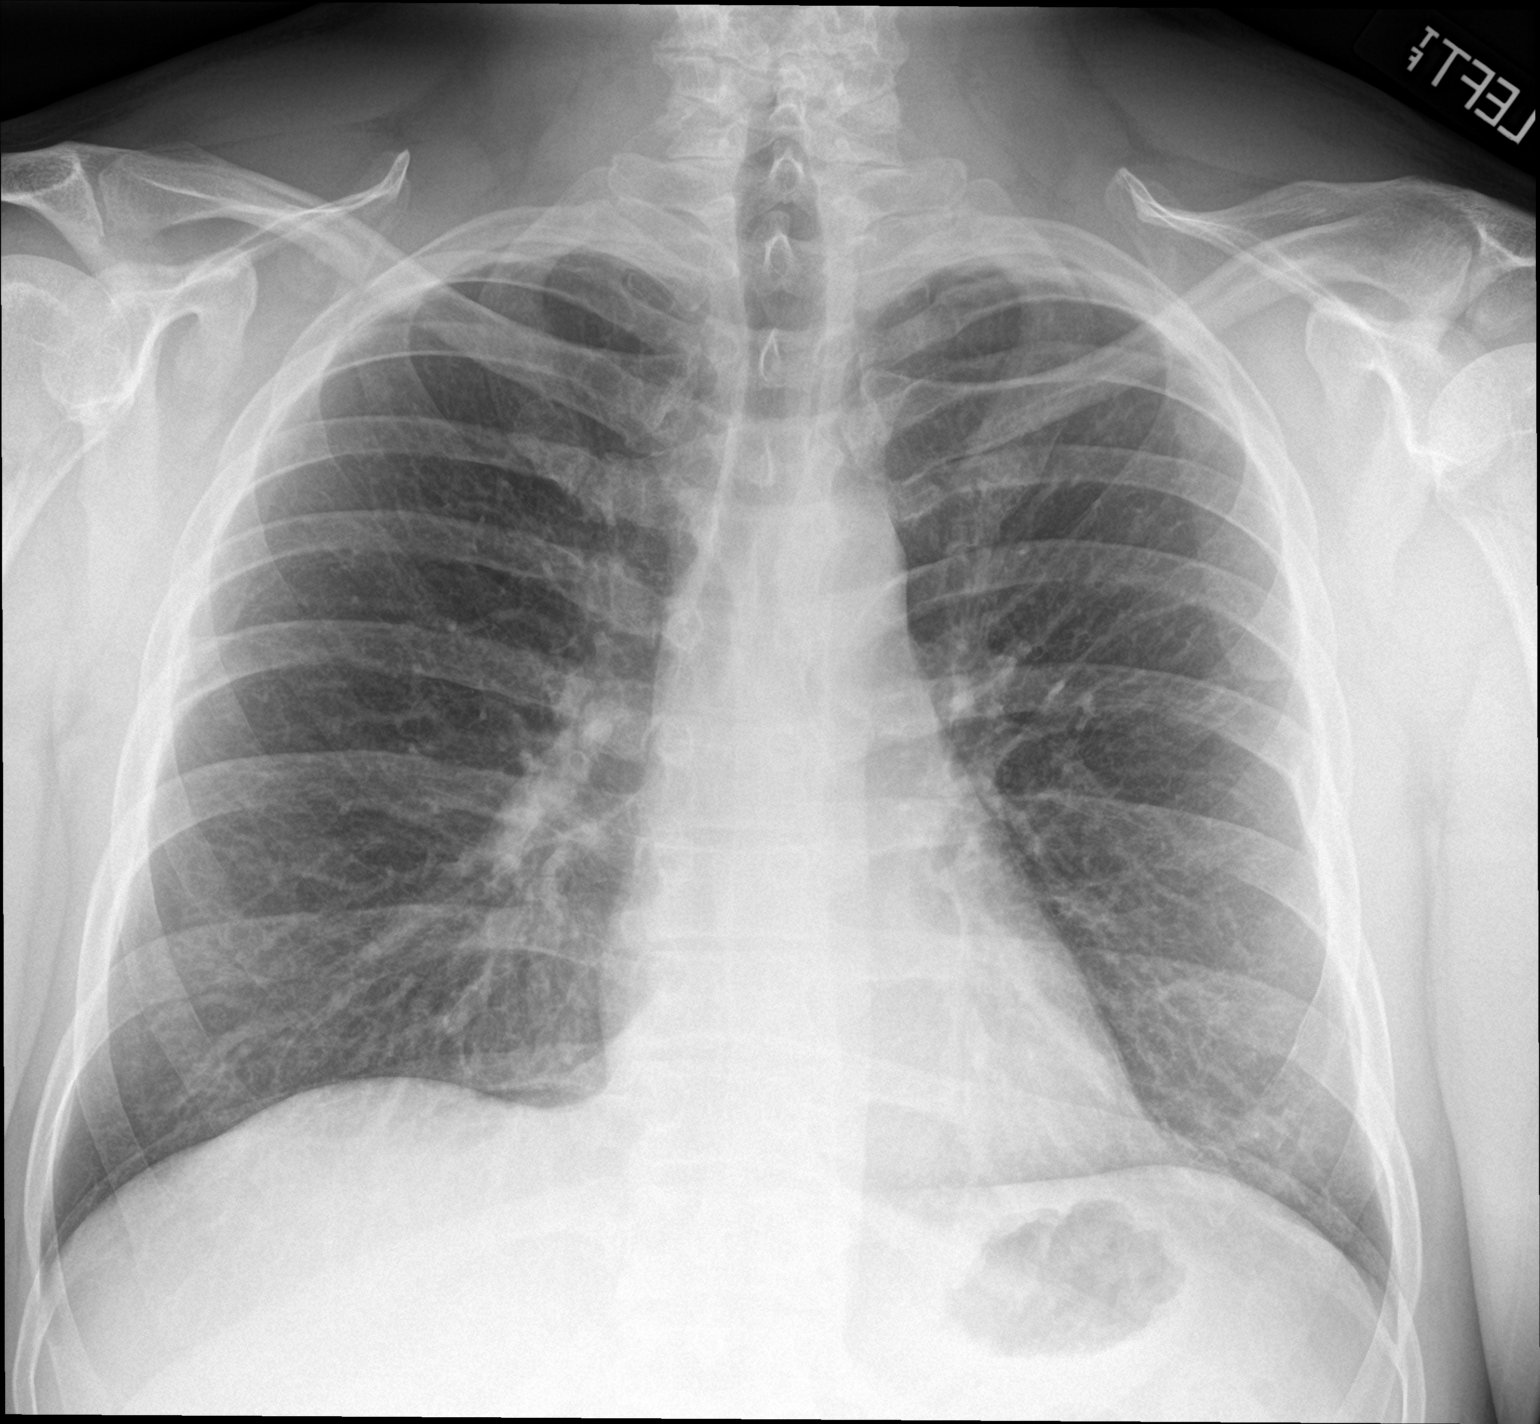

[2 of 2 positions shown; findings below may reference images not displayed]

FINDINGS: The lungs are well-aerated. Mild peribronchial thickening is noted.
There is no evidence of focal opacification, pleural effusion or
pneumothorax.

The heart is normal in size; the mediastinal contour is within
normal limits. No acute osseous abnormalities are seen.
IMPRESSION: Mild peribronchial thickening noted; lungs otherwise clear.
